# Patient Record
Sex: Female | Born: 1970 | Race: White | Hispanic: No | Marital: Single | Smoking: Never smoker
Health system: Southern US, Community
[De-identification: ages and names within clinical notes are randomized; demographics above are authoritative.]

## PROBLEM LIST (undated history)

## (undated) HISTORY — PX: MENISCUS REPAIR: SHX5179

---
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
## 1995-07-09 HISTORY — PX: GALLBLADDER SURGERY: SHX652

## 2006-02-18 DIAGNOSIS — N3946 Mixed incontinence: Secondary | ICD-10-CM | POA: Insufficient documentation

## 2019-07-09 HISTORY — PX: SIGMOIDECTOMY: SHX176

## 2021-03-20 DIAGNOSIS — K5909 Other constipation: Secondary | ICD-10-CM | POA: Insufficient documentation

## 2021-03-20 DIAGNOSIS — Z9049 Acquired absence of other specified parts of digestive tract: Secondary | ICD-10-CM | POA: Insufficient documentation

## 2021-03-20 DIAGNOSIS — K589 Irritable bowel syndrome without diarrhea: Secondary | ICD-10-CM | POA: Insufficient documentation

## 2021-03-20 DIAGNOSIS — Z8719 Personal history of other diseases of the digestive system: Secondary | ICD-10-CM | POA: Insufficient documentation

## 2021-05-22 LAB — HM PAP SMEAR: HPV, high-risk: NEGATIVE

## 2021-07-11 DIAGNOSIS — Z1231 Encounter for screening mammogram for malignant neoplasm of breast: Secondary | ICD-10-CM | POA: Diagnosis not present

## 2021-07-11 DIAGNOSIS — Z809 Family history of malignant neoplasm, unspecified: Secondary | ICD-10-CM | POA: Diagnosis not present

## 2021-08-28 DIAGNOSIS — E669 Obesity, unspecified: Secondary | ICD-10-CM | POA: Insufficient documentation

## 2021-08-28 DIAGNOSIS — K5730 Diverticulosis of large intestine without perforation or abscess without bleeding: Secondary | ICD-10-CM | POA: Insufficient documentation

## 2021-08-30 DIAGNOSIS — Z23 Encounter for immunization: Secondary | ICD-10-CM | POA: Diagnosis not present

## 2021-08-30 DIAGNOSIS — Z13220 Encounter for screening for lipoid disorders: Secondary | ICD-10-CM | POA: Diagnosis not present

## 2021-08-30 DIAGNOSIS — K581 Irritable bowel syndrome with constipation: Secondary | ICD-10-CM | POA: Diagnosis not present

## 2021-08-30 DIAGNOSIS — Z1329 Encounter for screening for other suspected endocrine disorder: Secondary | ICD-10-CM | POA: Diagnosis not present

## 2021-08-30 DIAGNOSIS — E669 Obesity, unspecified: Secondary | ICD-10-CM | POA: Diagnosis not present

## 2021-08-30 DIAGNOSIS — Z0001 Encounter for general adult medical examination with abnormal findings: Secondary | ICD-10-CM | POA: Diagnosis not present

## 2021-08-30 DIAGNOSIS — Z9049 Acquired absence of other specified parts of digestive tract: Secondary | ICD-10-CM | POA: Diagnosis not present

## 2021-08-30 DIAGNOSIS — Z8719 Personal history of other diseases of the digestive system: Secondary | ICD-10-CM | POA: Diagnosis not present

## 2021-08-30 DIAGNOSIS — Z6835 Body mass index (BMI) 35.0-35.9, adult: Secondary | ICD-10-CM | POA: Diagnosis not present

## 2021-08-30 DIAGNOSIS — Z0000 Encounter for general adult medical examination without abnormal findings: Secondary | ICD-10-CM | POA: Diagnosis not present

## 2021-09-01 ENCOUNTER — Other Ambulatory Visit: Payer: Self-pay | Admitting: Obstetrics and Gynecology

## 2021-09-01 DIAGNOSIS — Z9189 Other specified personal risk factors, not elsewhere classified: Secondary | ICD-10-CM

## 2021-09-01 DIAGNOSIS — Z803 Family history of malignant neoplasm of breast: Secondary | ICD-10-CM

## 2021-09-30 ENCOUNTER — Encounter: Payer: Self-pay | Admitting: Internal Medicine

## 2021-09-30 DIAGNOSIS — Z9889 Other specified postprocedural states: Secondary | ICD-10-CM | POA: Insufficient documentation

## 2021-09-30 NOTE — Progress Notes (Signed)
? ? ? ? ?  Subjective:  ? ? Patient ID: Katelyn Horne, female    DOB: 02/13/1971, 51 y.o.   MRN: [id number] ? ?This visit occurred during the SARS-CoV-2 public health emergency.  Safety protocols were in place, including screening questions prior to the visit, additional usage of staff PPE, and extensive cleaning of exam room while observing appropriate contact time as indicated for disinfecting solutions.   ? ? ?HPI ?She is here to establish with a new pcp.  Katelyn Horne is here for follow up of her chronic medical problems, including diverticulosis, IBS, obesity ? ? ?She runs regularly, she is very frustrated that she can not lose weight.  She 4 times a week 2 miles each. She lifts weights daily.  ? ?She has done optivia.  She did not like it and did not work.  ? ?She drinks a lot of water. ? ?She is menopausal. ? ? ?Medications and allergies reviewed with patient and updated if appropriate. ? ?No current outpatient medications on file prior to visit.  ? ?No current facility-administered medications on file prior to visit.  ? ? ? ?Review of Systems  ?Constitutional:  Negative for fever.  ?Respiratory:  Negative for cough, shortness of breath and wheezing.   ?Cardiovascular:  Positive for leg swelling (end of day). Negative for chest pain and palpitations.  ?Gastrointestinal:  Positive for constipation. Negative for abdominal pain and nausea.  ?Musculoskeletal:  Positive for arthralgias (left knee). Negative for back pain.  ?Neurological:  Negative for light-headedness and headaches.  ?Psychiatric/Behavioral:  Negative for dysphoric mood. The patient is not nervous/anxious.   ? ?   ?Objective:  ? ?Vitals:  ? 10/01/21 0905  ?BP: 138/82  ?Pulse: (!) 44  ?Temp: 98.6 ?F (37 ?C)  ?SpO2: 99%  ? ?BP Readings from Last 3 Encounters:  ?10/01/21 138/82  ? ?Wt Readings from Last 3 Encounters:  ?10/01/21 252 lb (114.3 kg)  ? ?Body mass index is 36.16 kg/m?. ? ?  ?Physical Exam ?Constitutional:   ?   General: She is not in acute  distress. ?   Appearance: Normal appearance.  ?HENT:  ?   Head: Normocephalic and atraumatic.  ?Eyes:  ?   Conjunctiva/sclera: Conjunctivae normal.  ?Cardiovascular:  ?   Rate and Rhythm: Normal rate and regular rhythm.  ?   Heart sounds: Normal heart sounds. No murmur heard. ?Pulmonary:  ?   Effort: Pulmonary effort is normal. No respiratory distress.  ?   Breath sounds: Normal breath sounds. No wheezing.  ?Musculoskeletal:  ?   Cervical back: Neck supple.  ?   Right lower leg: No edema.  ?   Left lower leg: No edema.  ?Lymphadenopathy:  ?   Cervical: No cervical adenopathy.  ?Skin: ?   Findings: No rash.  ?Neurological:  ?   Mental Status: She is alert. Mental status is at baseline.  ?Psychiatric:     ?   Mood and Affect: Mood normal.     ?   Behavior: Behavior normal.  ? ?   ? ?No results found for: WBC, HGB, HCT, PLT, GLUCOSE, CHOL, TRIG, HDL, LDLDIRECT, LDLCALC, ALT, AST, NA, K, CL, CREATININE, BUN, CO2, TSH, PSA, INR, GLUF, HGBA1C, MICROALBUR ? ? ?Assessment & Plan:  ? ? ?See Problem List for Assessment and Plan of chronic medical problems.  ? ? ?

## 2021-09-30 NOTE — Patient Instructions (Addendum)
? ? ? ?  It was nice to meet you.  ? ? ? ?Medications changes include :   Wegovy 0.25 mg weekly ? ? ?Your prescription(s) have been sent to your pharmacy.  ? ? ?

## 2021-10-01 ENCOUNTER — Encounter: Payer: Self-pay | Admitting: Internal Medicine

## 2021-10-01 ENCOUNTER — Other Ambulatory Visit: Payer: Self-pay

## 2021-10-01 ENCOUNTER — Ambulatory Visit: Payer: BC Managed Care – PPO | Admitting: Internal Medicine

## 2021-10-01 VITALS — BP 138/82 | HR 44 | Temp 98.6°F | Ht 70.0 in | Wt 252.0 lb

## 2021-10-01 DIAGNOSIS — K5909 Other constipation: Secondary | ICD-10-CM | POA: Diagnosis not present

## 2021-10-01 DIAGNOSIS — E6609 Other obesity due to excess calories: Secondary | ICD-10-CM | POA: Diagnosis not present

## 2021-10-01 DIAGNOSIS — K589 Irritable bowel syndrome without diarrhea: Secondary | ICD-10-CM | POA: Diagnosis not present

## 2021-10-01 DIAGNOSIS — Z6836 Body mass index (BMI) 36.0-36.9, adult: Secondary | ICD-10-CM | POA: Diagnosis not present

## 2021-10-01 DIAGNOSIS — Z9889 Other specified postprocedural states: Secondary | ICD-10-CM | POA: Insufficient documentation

## 2021-10-01 MED ORDER — SEMAGLUTIDE-WEIGHT MANAGEMENT 0.25 MG/0.5ML ~~LOC~~ SOAJ
0.2500 mg | SUBCUTANEOUS | 0 refills | Status: DC
Start: 2021-10-01 — End: 2021-10-29

## 2021-10-01 NOTE — Assessment & Plan Note (Signed)
No issues since sigmoid removed ?

## 2021-10-01 NOTE — Assessment & Plan Note (Addendum)
Chronic ?Taking miralax daily - sometimes does not work well, but overall controlled ?Continue drinking plenty of fluids, regular exercise ?

## 2021-10-01 NOTE — Assessment & Plan Note (Signed)
Chronic ?After her sigmoidectomy and in addition with menopause she has gained a lot of weight ?She is very frustrated because she has not been able to lose the weight ?She is exercising a good amount of regular basis-running any daily weights ?She is eating reasonably healthy and trying to limit her portions ?She is interested in assistance with medications to help with the weight loss ?Can try Wegovy if covered by insurance-she can try using a savings card ?If this is not covered she may need to call her insurance because unfortunately they may not cover any weight loss medications ?Discussed calorie intake as being the deciding factor for her weight loss ?

## 2021-10-02 ENCOUNTER — Telehealth: Payer: Self-pay | Admitting: Internal Medicine

## 2021-10-02 NOTE — Telephone Encounter (Signed)
Rep w/ [employer] states PA is needed for Semaglutide-Weight Management 0.25 MG/0.5ML SOAJ ? ?Rep states last 2 digits of pt members ID is [id number]  ? ? ?

## 2021-10-08 NOTE — Telephone Encounter (Signed)
Key: [id number] ?

## 2021-10-10 ENCOUNTER — Encounter: Payer: Self-pay | Admitting: Internal Medicine

## 2021-10-11 ENCOUNTER — Other Ambulatory Visit: Payer: Self-pay

## 2021-10-18 NOTE — Telephone Encounter (Signed)
Your prior authorization for Mancel Parsons has been approved! ? ?MORE INFO ?Personalized support and financial assistance may be available through the [facility] [employer] program. For more information, and to see program requirements, click on the More Info button to the right. ? ?Message from plan: Effective from 10/08/2021 through 02/10/2022. Please note: This medication is to be titrated up in strength every 4 weeks as tolerated by the member. The quantity limit set of 4 pens per 180 days allows for this titration through all strengths using 4 pens of each strength every 28 days. ?

## 2021-10-18 NOTE — Telephone Encounter (Signed)
Message sent to patient today.

## 2021-10-29 ENCOUNTER — Other Ambulatory Visit: Payer: Self-pay | Admitting: Internal Medicine

## 2021-10-29 MED ORDER — WEGOVY 0.5 MG/0.5ML ~~LOC~~ SOAJ: 0.5000 mg | SUBCUTANEOUS | 0 refills | Status: DC

## 2021-11-29 ENCOUNTER — Encounter: Payer: Self-pay | Admitting: Internal Medicine

## 2021-11-29 MED ORDER — ONDANSETRON HCL 4 MG PO TABS: 4.0000 mg | ORAL_TABLET | Freq: Three times a day (TID) | ORAL | 0 refills | Status: DC | PRN

## 2021-11-29 MED ORDER — WEGOVY 1 MG/0.5ML ~~LOC~~ SOAJ: 1.0000 mg | SUBCUTANEOUS | 0 refills | Status: DC

## 2021-12-01 ENCOUNTER — Other Ambulatory Visit: Payer: BC Managed Care – PPO

## 2021-12-10 ENCOUNTER — Encounter: Payer: Self-pay | Admitting: Podiatry

## 2021-12-10 ENCOUNTER — Other Ambulatory Visit: Payer: Self-pay | Admitting: Podiatry

## 2021-12-10 ENCOUNTER — Ambulatory Visit (INDEPENDENT_AMBULATORY_CARE_PROVIDER_SITE_OTHER): Payer: BC Managed Care – PPO

## 2021-12-10 ENCOUNTER — Ambulatory Visit: Payer: BC Managed Care – PPO | Admitting: Podiatry

## 2021-12-10 DIAGNOSIS — M778 Other enthesopathies, not elsewhere classified: Secondary | ICD-10-CM

## 2021-12-10 DIAGNOSIS — S99921A Unspecified injury of right foot, initial encounter: Secondary | ICD-10-CM | POA: Diagnosis not present

## 2021-12-10 DIAGNOSIS — M2041 Other hammer toe(s) (acquired), right foot: Secondary | ICD-10-CM

## 2021-12-10 DIAGNOSIS — D3613 Benign neoplasm of peripheral nerves and autonomic nervous system of lower limb, including hip: Secondary | ICD-10-CM

## 2021-12-10 NOTE — Progress Notes (Signed)
Subjective:  Patient ID: Katelyn Horne, female    DOB: [birth year],  MRN: [id number] HPI Chief Complaint  Patient presents with   Foot Pain    Plantar forefoot and 2nd/3rd toes right - aching, swelling, burning, numbness x couple years, just moved from [place] a year ago and has been having foot treated for a neuroma-stretching, padding, injection, now noticing toes are hammering   New Patient (Initial Visit)    51 y.o. female presents with the above complaint.   ROS: Denies fever chills nausea vomiting muscle aches pains calf pain back pain chest pain shortness of breath.  She states that she is very active individual and has seen a podiatrist in the past in [place] who stated that she had a neuroma.  She has tried and failed conservative therapies including injections steroids nonsteroidals therapies shoe gear changes and activity changes.  States that this is affecting her ability to perform her daily activities and maintain good health.  No past medical history on file. No past surgical history on file.  Current Outpatient Medications:    ondansetron (ZOFRAN) 4 MG tablet, Take 1 tablet (4 mg total) by mouth every 8 (eight) hours as needed for nausea or vomiting., Disp: 20 tablet, Rfl: 0   Semaglutide-Weight Management (WEGOVY) 1 MG/0.5ML SOAJ, Inject 1 mg into the skin once a week., Disp: 2 mL, Rfl: 0  Allergies  Allergen Reactions   Amoxicillin-Pot Clavulanate Hives, Rash and Other (See Comments)   Iodinated Contrast Media Rash   Iodine-131 Rash   Review of Systems Objective:  There were no vitals filed for this visit.  General: Well developed, nourished, in no acute distress, alert and oriented x3   Dermatological: Skin is warm, dry and supple bilateral. Nails x 10 are well maintained; remaining integument appears unremarkable at this time. There are no open sores, no preulcerative lesions, no rash or signs of infection present.  Vascular: Dorsalis Pedis artery and  Posterior Tibial artery pedal pulses are 2/4 bilateral with immedate capillary fill time. Pedal hair growth present. No varicosities and no lower extremity edema present bilateral.   Neruologic: Grossly intact via light touch bilateral. Vibratory intact via tuning fork bilateral. Protective threshold with Semmes Wienstein monofilament intact to all pedal sites bilateral. Patellar and Achilles deep tendon reflexes 2+ bilateral. No Babinski or clonus noted bilateral.   Musculoskeletal: No gross boney pedal deformities bilateral. No pain, crepitus, or limitation noted with foot and ankle range of motion bilateral. Muscular strength 5/5 in all groups tested bilateral.  She has pain on palpation and end range of motion of the second metatarsophalangeal joint with some tenderness on palpation of the PIPJ and radiating pain from that dorsal medial aspect of the PIPJ.  There is mild edema on the plantar aspect of the foot and other lesser metatarsophalangeal joints are nontender.  Gait: Unassisted, Nonantalgic.    Radiographs: Radiographs taken today demonstrate an osseously mature individual plantarflexed elongated second metatarsal of the right foot with a cocked up hammertoe deformity which appears to be rigid in nature at the PIPJ.  She does have some soft tissue swelling around the metatarsal phalangeal joint there is some mild medial deviation of the second toe indicating a possible tear of the lateral collateral ligament or the plantar plate.  No other digital deformities are identified on this right foot exam.    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  Plan: At this point discussed appropriate shoe gear.  We are also requesting an MRI of this right foot specifically the second metatarsophalangeal joint area for  evaluation of the plantar plate.  This is for differential diagnoses and surgical planning.     Shanedra Lave T. [place], [place]

## 2021-12-14 ENCOUNTER — Encounter: Payer: Self-pay | Admitting: Internal Medicine

## 2021-12-17 ENCOUNTER — Ambulatory Visit
Admission: RE | Admit: 2021-12-17 | Discharge: 2021-12-17 | Disposition: A | Discharge status: Home / Self Care | Payer: BC Managed Care – PPO | Source: Ambulatory Visit | Attending: Podiatry | Admitting: Podiatry

## 2021-12-17 DIAGNOSIS — G5761 Lesion of plantar nerve, right lower limb: Secondary | ICD-10-CM | POA: Diagnosis not present

## 2021-12-17 DIAGNOSIS — M2041 Other hammer toe(s) (acquired), right foot: Secondary | ICD-10-CM

## 2021-12-17 DIAGNOSIS — S99921A Unspecified injury of right foot, initial encounter: Secondary | ICD-10-CM

## 2021-12-26 ENCOUNTER — Encounter: Payer: Self-pay | Admitting: Podiatry

## 2021-12-26 ENCOUNTER — Ambulatory Visit: Payer: BC Managed Care – PPO | Admitting: Podiatry

## 2021-12-26 DIAGNOSIS — M21961 Unspecified acquired deformity of right lower leg: Secondary | ICD-10-CM | POA: Diagnosis not present

## 2021-12-26 DIAGNOSIS — M2041 Other hammer toe(s) (acquired), right foot: Secondary | ICD-10-CM

## 2021-12-26 NOTE — Progress Notes (Signed)
She presents today for follow-up of her pain to her right foot.  She states that really has not improved at all she states that she went running yesterday and it really has been painful.  Objective: Vital signs stable alert and oriented x3.  Pulses are palpable.  Cocked up hammertoe deformities #2 #3 of the right foot with pain to the third interdigital space of the right foot.  MRI states that there is a partial tear of the plantar plate of the right second metatarsal phalangeal joint with hammertoe deformities #2 and #3 and a neuroma to the third interdigital space of the right foot.  Assessment: Pain in limb secondary to capsulitis hammertoe deformity plantar plate tear and neuroma third interspace right foot.  Plan: Discussed etiology pathology and surgical therapies at this point we consented her today for a surgical repair of the plantar plate second metatarsal osteotomy hammertoe repair #2 #3 with pain and and a neurectomy to the third interdigital space of the right foot.  She understands this and is amenable to it we did discuss possible postop complications which may include but not limited to postop pain bleeding swell infection recurrence need for further surgery overcorrection under correction also digit loss of limb loss of life.  Follow-up with her in the near future for surgical intervention.

## 2022-01-02 MED ORDER — WEGOVY 1.7 MG/0.75ML ~~LOC~~ SOAJ
1.7000 mg | SUBCUTANEOUS | 0 refills | Status: DC
Start: 2022-01-02 — End: 2022-02-06

## 2022-01-29 ENCOUNTER — Telehealth: Payer: Self-pay | Admitting: Urology

## 2022-01-29 NOTE — Telephone Encounter (Signed)
DOS - 02/15/22  METATARSAL OSTEOTOMY 2ND RIGHT --- 28308 HAMMERTOE REPAIR 2ND RIGHT --- 09200 NEURECTOMY 3RD RIGHT --- 41593  [employer] EFFECTIVE DATE - 07/08/21  PLAN DEDUCTIBLE - $1,000.00 W/ $47.92 REMAINING OUT OF POCKET - $3,500.00 W/ $2,172.15 REMAINING COINSURANCE - 20% COPAY - $0.00  NO PRIOR AUTH IS REQUIRED.

## 2022-02-05 ENCOUNTER — Encounter: Payer: Self-pay | Admitting: Internal Medicine

## 2022-02-05 HISTORY — PX: FOOT NEUROMA SURGERY: SHX646

## 2022-02-05 HISTORY — PX: OTHER SURGICAL HISTORY: SHX169

## 2022-02-06 MED ORDER — SEMAGLUTIDE-WEIGHT MANAGEMENT 2.4 MG/0.75ML ~~LOC~~ SOAJ: 2.4000 mg | SUBCUTANEOUS | 0 refills | Status: DC

## 2022-02-09 ENCOUNTER — Ambulatory Visit
Admission: RE | Admit: 2022-02-09 | Discharge: 2022-02-09 | Disposition: A | Discharge status: Home / Self Care | Payer: BC Managed Care – PPO | Source: Ambulatory Visit | Attending: Obstetrics and Gynecology | Admitting: Obstetrics and Gynecology

## 2022-02-09 DIAGNOSIS — Z1239 Encounter for other screening for malignant neoplasm of breast: Secondary | ICD-10-CM | POA: Diagnosis not present

## 2022-02-09 DIAGNOSIS — Z9189 Other specified personal risk factors, not elsewhere classified: Secondary | ICD-10-CM

## 2022-02-09 DIAGNOSIS — Z803 Family history of malignant neoplasm of breast: Secondary | ICD-10-CM

## 2022-02-09 MED ORDER — GADOBUTROL 1 MMOL/ML IV SOLN
10.0000 mL | Freq: Once | INTRAVENOUS | Status: AC | PRN
Start: 2022-02-09 — End: 2022-02-09
  Administered 2022-02-09: 10 mL via INTRAVENOUS

## 2022-02-13 ENCOUNTER — Other Ambulatory Visit: Payer: Self-pay | Admitting: Podiatry

## 2022-02-13 MED ORDER — OXYCODONE-ACETAMINOPHEN 10-325 MG PO TABS: 1.0000 | ORAL_TABLET | Freq: Three times a day (TID) | ORAL | 0 refills | Status: AC | PRN

## 2022-02-13 MED ORDER — CLINDAMYCIN HCL 150 MG PO CAPS: 150.0000 mg | ORAL_CAPSULE | Freq: Three times a day (TID) | ORAL | 0 refills | Status: DC

## 2022-02-13 MED ORDER — ONDANSETRON HCL 4 MG PO TABS: 4.0000 mg | ORAL_TABLET | Freq: Three times a day (TID) | ORAL | 0 refills | Status: DC | PRN

## 2022-02-15 ENCOUNTER — Other Ambulatory Visit: Payer: Self-pay | Admitting: Podiatrist

## 2022-02-15 DIAGNOSIS — M2041 Other hammer toe(s) (acquired), right foot: Secondary | ICD-10-CM | POA: Diagnosis not present

## 2022-02-15 DIAGNOSIS — M205X1 Other deformities of toe(s) (acquired), right foot: Secondary | ICD-10-CM | POA: Diagnosis not present

## 2022-02-15 DIAGNOSIS — M21541 Acquired clubfoot, right foot: Secondary | ICD-10-CM | POA: Diagnosis not present

## 2022-02-15 DIAGNOSIS — G8918 Other acute postprocedural pain: Secondary | ICD-10-CM | POA: Diagnosis not present

## 2022-02-15 DIAGNOSIS — G5761 Lesion of plantar nerve, right lower limb: Secondary | ICD-10-CM | POA: Diagnosis not present

## 2022-02-15 MED ORDER — TRAMADOL HCL 50 MG PO TABS: 50.0000 mg | ORAL_TABLET | Freq: Three times a day (TID) | ORAL | 0 refills | Status: AC | PRN

## 2022-02-15 NOTE — Progress Notes (Signed)
Rx for Tramadol called in for post op pain at Dr. Stephenie Acres request

## 2022-02-20 ENCOUNTER — Encounter: Payer: Self-pay | Admitting: Podiatry

## 2022-02-20 ENCOUNTER — Ambulatory Visit (INDEPENDENT_AMBULATORY_CARE_PROVIDER_SITE_OTHER): Payer: BC Managed Care – PPO

## 2022-02-20 ENCOUNTER — Ambulatory Visit (INDEPENDENT_AMBULATORY_CARE_PROVIDER_SITE_OTHER): Payer: BC Managed Care – PPO | Admitting: Podiatry

## 2022-02-20 DIAGNOSIS — M2041 Other hammer toe(s) (acquired), right foot: Secondary | ICD-10-CM

## 2022-02-20 DIAGNOSIS — Z9889 Other specified postprocedural states: Secondary | ICD-10-CM

## 2022-02-20 DIAGNOSIS — M21961 Unspecified acquired deformity of right lower leg: Secondary | ICD-10-CM

## 2022-02-20 NOTE — Progress Notes (Signed)
She presents today for her first postop visit date of surgery 02/15/2022 with metatarsal osteotomy second right hammertoe repair #2 #3 with a neurectomy third interdigital space of the right foot.  States that is been really painful the pain medication is not really helping though she is referring to the tramadol because she does not want to take the oxycodone.  She denies fever chills nausea vomiting muscle aches pains calf pain back pain chest pain shortness of breath.  Objective: Vital signs are stable alert oriented x3 CAM Walker is intact was removed dry sterile dressing is intact was removed demonstrates mild erythema about the toes of the surgical portion of the foot K wires are in place.  Radiographs taken today demonstrate good placement of the internal fixation to the second metatarsal osteotomy as well as the second and third toes with good approximation of the arthrodesis site.  Assessment: Well-healing surgical foot right.  Plan: Redressed today dressed a compressive dressing placed her back in her cam walker follow-up with her in 1 week make sure she is doing well.  Explained her sutures may not come out next week she understands and is amenable to it but we would like to switch her down to a smaller boot or shoe.

## 2022-02-27 ENCOUNTER — Encounter: Payer: Self-pay | Admitting: Podiatry

## 2022-02-27 ENCOUNTER — Ambulatory Visit (INDEPENDENT_AMBULATORY_CARE_PROVIDER_SITE_OTHER): Payer: BC Managed Care – PPO | Admitting: Podiatry

## 2022-02-27 DIAGNOSIS — M2041 Other hammer toe(s) (acquired), right foot: Secondary | ICD-10-CM

## 2022-02-27 DIAGNOSIS — Z9889 Other specified postprocedural states: Secondary | ICD-10-CM

## 2022-02-27 DIAGNOSIS — M21961 Unspecified acquired deformity of right lower leg: Secondary | ICD-10-CM

## 2022-02-27 NOTE — Progress Notes (Signed)
Patient presents today for post op visit # 2, patient of Dr. Milinda Pointer.   POV #2 DOS 02/15/2022 2ND METATARSALOSTEOTOMY RT, HAMMERTOE REPAIR 2ND/3RD TOES RT, NEURECTOMY 3RD IDS RT FOOT   She presents in her walking boot. Denies any falls or injury to the foot. There is a blood blister at the 2nd toe distal incision. No signs of infection. No calf pain or shortness of breath. Bandages dry and intact. Incision and sutures intact. She is having minimal pain, but concerned about the 2nd toe.   Sutures removed today from the 3rd toe. Sutures remain in 2nd toe until next week. Redressed with a light DSD and covered with an acewrap.       Placed her back in the boot until re-evaluation next week. Reviewed icing and elevation. She will follow up with Dr. Milinda Pointer next week for suture removal from the 2nd toe. She will also move her next appointment out 1 more week as well.

## 2022-02-28 NOTE — Patient Instructions (Addendum)
       Medications changes include :   none     Your prescription(s) have been sent to your pharmacy.      Return in about 6 months (around 09/01/2022).

## 2022-02-28 NOTE — Progress Notes (Signed)
      Subjective:    Patient ID: Katelyn Horne, female    DOB: Mar 13, 1971, 51 y.o.   MRN: [id number]     HPI Radley is here for follow up of her chronic medical problems, including obesity.  She is currently on wegovy 2.4 mg weekly.  She has waves of nausea at times. She has constipation but takes miralax daily.  This is controlled.  Her appetite is very low.  She eats protein shakes, pretzels, chicken, fish.  She feels like she is eating well overall.  She is very happy with medication and the effect that it has had on her weight.  She feels the side effects are mild and controllable.    Wt Readings from Last 3 Encounters:  03/01/22 224 lb (101.6 kg)  10/01/21 252 lb (114.3 kg)   Her goal weight is 190 lbs.      Medications and allergies reviewed with patient and updated if appropriate.  Current Outpatient Medications on File Prior to Visit  Medication Sig Dispense Refill   ondansetron (ZOFRAN) 4 MG tablet Take 1 tablet (4 mg total) by mouth every 8 (eight) hours as needed. 20 tablet 0   Semaglutide-Weight Management 2.4 MG/0.75ML SOAJ Inject 2.4 mg into the skin once a week. 3 mL 0   No current facility-administered medications on file prior to visit.     Review of Systems  Constitutional:  Negative for fatigue and fever.  Gastrointestinal:  Positive for abdominal distention, constipation and nausea. Negative for abdominal pain.       No gerd  Neurological:  Positive for light-headedness (when she does not eat enough). Negative for headaches.  Psychiatric/Behavioral:  Positive for sleep disturbance (interrupted sleep).        Objective:   Vitals:   03/01/22 1303  BP: 110/82  Pulse: 71  Temp: 98.2 F (36.8 C)  SpO2: 97%   BP Readings from Last 3 Encounters:  03/01/22 110/82  10/01/21 138/82   Wt Readings from Last 3 Encounters:  03/01/22 224 lb (101.6 kg)  10/01/21 252 lb (114.3 kg)   Body mass index is 32.14 kg/m.    Physical  Exam Constitutional:      General: She is not in acute distress.    Appearance: Normal appearance.  HENT:     Head: Normocephalic and atraumatic.  Eyes:     Conjunctiva/sclera: Conjunctivae normal.  Cardiovascular:     Rate and Rhythm: Normal rate and regular rhythm.     Heart sounds: Normal heart sounds. No murmur heard. Pulmonary:     Effort: Pulmonary effort is normal. No respiratory distress.     Breath sounds: Normal breath sounds. No wheezing.  Musculoskeletal:     Right lower leg: No edema.     Left lower leg: No edema.  Skin:    General: Skin is warm and dry.     Findings: No rash.  Neurological:     Mental Status: She is alert. Mental status is at baseline.  Psychiatric:        Mood and Affect: Mood normal.        Behavior: Behavior normal.        No results found for: "WBC", "HGB", "HCT", "PLT", "GLUCOSE", "CHOL", "TRIG", "HDL", "LDLDIRECT", "LDLCALC", "ALT", "AST", "NA", "K", "CL", "CREATININE", "BUN", "CO2", "TSH", "PSA", "INR", "GLUF", "HGBA1C", "MICROALBUR"   Assessment & Plan:    See Problem List for Assessment and Plan of chronic medical problems.

## 2022-03-01 ENCOUNTER — Encounter: Payer: Self-pay | Admitting: Internal Medicine

## 2022-03-01 ENCOUNTER — Ambulatory Visit: Payer: BC Managed Care – PPO | Admitting: Internal Medicine

## 2022-03-01 DIAGNOSIS — E669 Obesity, unspecified: Secondary | ICD-10-CM

## 2022-03-01 DIAGNOSIS — Z9049 Acquired absence of other specified parts of digestive tract: Secondary | ICD-10-CM

## 2022-03-01 MED ORDER — SEMAGLUTIDE-WEIGHT MANAGEMENT 2.4 MG/0.75ML ~~LOC~~ SOAJ: 2.4000 mg | SUBCUTANEOUS | 0 refills | Status: DC

## 2022-03-01 NOTE — Assessment & Plan Note (Signed)
Chronic Currently on the Wegovy 2.4 mg weekly and has some mild nausea and constipation, but symptoms are mild and controllable-overall tolerating medication well Medication has been very effective Starting weight 252 pound-weight today 224 pound-weight loss of 28 pounds She just had surgery on her foot and is not able to exercise, but typically exercises regularly with running and walking and will get back to that as soon as she is able Eating healthy-stressed making sure she is getting enough protein, vegetables and fruits in her diet.  She is eating although carbohydrate/sugar diet Discussed short-term and long-term goals with the weight loss Her goal weight is approximately 190 pounds Would recommend continuing Wegovy at 2.4 mg weekly for now Consider in the future reducing dose and finding a maintenance dose Stressed again regular exercise, healthy diet with small portions and lifestyle to help weight loss efforts and then to help maintain goal weight once we get their Wegovy 2.4 mg weekly sent to pharmacy

## 2022-03-06 ENCOUNTER — Ambulatory Visit (INDEPENDENT_AMBULATORY_CARE_PROVIDER_SITE_OTHER): Payer: BC Managed Care – PPO | Admitting: Podiatry

## 2022-03-06 ENCOUNTER — Encounter: Payer: Self-pay | Admitting: Podiatry

## 2022-03-06 ENCOUNTER — Telehealth: Payer: Self-pay

## 2022-03-06 DIAGNOSIS — M2041 Other hammer toe(s) (acquired), right foot: Secondary | ICD-10-CM | POA: Diagnosis not present

## 2022-03-06 DIAGNOSIS — Z9889 Other specified postprocedural states: Secondary | ICD-10-CM

## 2022-03-06 DIAGNOSIS — M21961 Unspecified acquired deformity of right lower leg: Secondary | ICD-10-CM

## 2022-03-06 NOTE — Telephone Encounter (Signed)
Katelyn Horne ([name]) Rx #: [id number] PEAKLT 2.'4MG'$ /0.75ML auto-injectors

## 2022-03-06 NOTE — Progress Notes (Signed)
She presents today for follow-up of her second metatarsal osteotomy hammertoe repair #2 #3 of the right foot with neurectomy.  She states that she seems to be doing good feels all right just a little sore occasionally.  Objective: Vital signs are stable alert and oriented x3 there is no erythema edema cellulitis drainage or odor sutures are intact margins well coapted some of the sutures were removed last week I removed the sutures to the second toe today.  She has a small area of blistering near the distal aspect of the toe medially.  Assessment: Well-healing surgical toe K wires are intact.  Plan: Redressed her today and put her in a Darco shoe or follow-up with her in a week to 2 weeks just to redress.

## 2022-03-18 ENCOUNTER — Encounter: Payer: BC Managed Care – PPO | Admitting: Podiatry

## 2022-03-18 ENCOUNTER — Ambulatory Visit (INDEPENDENT_AMBULATORY_CARE_PROVIDER_SITE_OTHER): Payer: BC Managed Care – PPO

## 2022-03-18 ENCOUNTER — Ambulatory Visit (INDEPENDENT_AMBULATORY_CARE_PROVIDER_SITE_OTHER): Payer: BC Managed Care – PPO | Admitting: Podiatry

## 2022-03-18 ENCOUNTER — Encounter: Payer: Self-pay | Admitting: Internal Medicine

## 2022-03-18 DIAGNOSIS — M2041 Other hammer toe(s) (acquired), right foot: Secondary | ICD-10-CM | POA: Diagnosis not present

## 2022-03-18 DIAGNOSIS — M21961 Unspecified acquired deformity of right lower leg: Secondary | ICD-10-CM

## 2022-03-18 DIAGNOSIS — Z9889 Other specified postprocedural states: Secondary | ICD-10-CM

## 2022-03-18 NOTE — Progress Notes (Signed)
She presents today for postop visit date of surgery 02/15/2022 Seck metatarsal osteotomy and hammertoe repair #2 #3 and neurectomy on the right foot.  Denies fever chills nausea vomit muscle aches pains states that she seems to be doing okay she is just tired of sitting around waiting for these toes to get better.  Objective: Vital signs are stable alert oriented x3 mild edema no erythema cellulitis drainage or odor eschars are still present.  K wires are in place but slightly extruded.  No purulence no malodor radiographs taken today demonstrate well-healing arthrodesis PIPJ.  Assessment: Well-healing surgical foot.  Plan: Follow-up with me in 2 weeks to pull the pins.  X-rays will be necessary at that time.

## 2022-04-01 ENCOUNTER — Encounter: Payer: Self-pay | Admitting: Podiatry

## 2022-04-01 ENCOUNTER — Ambulatory Visit (INDEPENDENT_AMBULATORY_CARE_PROVIDER_SITE_OTHER): Payer: BC Managed Care – PPO | Admitting: Podiatry

## 2022-04-01 ENCOUNTER — Ambulatory Visit (INDEPENDENT_AMBULATORY_CARE_PROVIDER_SITE_OTHER): Payer: BC Managed Care – PPO

## 2022-04-01 ENCOUNTER — Telehealth: Payer: Self-pay

## 2022-04-01 DIAGNOSIS — M2041 Other hammer toe(s) (acquired), right foot: Secondary | ICD-10-CM

## 2022-04-01 DIAGNOSIS — M21961 Unspecified acquired deformity of right lower leg: Secondary | ICD-10-CM | POA: Diagnosis not present

## 2022-04-01 DIAGNOSIS — Z9889 Other specified postprocedural states: Secondary | ICD-10-CM

## 2022-04-01 NOTE — Progress Notes (Signed)
She presents today date of surgery 02/15/2022 Seck metatarsal osteotomy right with hammertoe repair second and third neurectomy third interdigital space right foot.  She states that I am ready for these pins to come out she states that I can feel that moving around.  Objective: Vital signs are stable she is alert oriented x3 there is no erythema to some mild edema no cellulitis drainage odor incision sites have gone on to heal uneventfully dry scaly skin to the foot itself.  K wires to the second and third toes are loose.  Radiographs taken today demonstrate arthrodesis of the PIPJ second and third toes appears to be complete.  K wires appear to be in 1 piece.  Assessment: Well-healing surgical foot x6 weeks  Plan: Removed K wire today without any anesthesia she tolerated the procedure well without complications.  K wires were removed they were in 1 piece and in total.  I would allow her to wear her Darco shoe and start washing this tomorrow I will follow-up with her in 2 to 3 weeks.

## 2022-04-01 NOTE — Telephone Encounter (Signed)
Wegovy approved from 03/20/22- 03/19/2023.  Ref # [id number]

## 2022-04-24 ENCOUNTER — Encounter: Payer: Self-pay | Admitting: Podiatry

## 2022-04-24 ENCOUNTER — Ambulatory Visit (INDEPENDENT_AMBULATORY_CARE_PROVIDER_SITE_OTHER): Payer: BC Managed Care – PPO | Admitting: Podiatry

## 2022-04-24 ENCOUNTER — Ambulatory Visit (INDEPENDENT_AMBULATORY_CARE_PROVIDER_SITE_OTHER): Payer: BC Managed Care – PPO

## 2022-04-24 DIAGNOSIS — M21961 Unspecified acquired deformity of right lower leg: Secondary | ICD-10-CM | POA: Diagnosis not present

## 2022-04-24 DIAGNOSIS — M2041 Other hammer toe(s) (acquired), right foot: Secondary | ICD-10-CM

## 2022-04-24 DIAGNOSIS — Z9889 Other specified postprocedural states: Secondary | ICD-10-CM

## 2022-04-24 NOTE — Progress Notes (Signed)
She presents today for follow-up she is status post neurectomy third interdigital space right foot second metatarsal osteotomy with screw fixation and hammertoe repair to toes #2 and #3 of the right foot.  She states that is still stiff and gets a little tender with walking.  Objective: Vital signs are stable alert and oriented x3.  Pulses are palpable.  Her toes are sitting rectus in very good position.  She has some tenderness on end range of motion dorsiflexion but minimally plantarflexion.  She is still has some scar that has subcutaneous adhesions.  Assessment: Well-healing surgical foot limited in motion most likely secondary to scar tissue.  Plan: Referring to physical therapy for range of motion and scar tissue resolution.

## 2022-04-30 ENCOUNTER — Other Ambulatory Visit: Payer: Self-pay

## 2022-04-30 MED ORDER — SEMAGLUTIDE-WEIGHT MANAGEMENT 2.4 MG/0.75ML ~~LOC~~ SOAJ: 2.4000 mg | SUBCUTANEOUS | 0 refills | Status: DC

## 2022-05-16 ENCOUNTER — Telehealth (INDEPENDENT_AMBULATORY_CARE_PROVIDER_SITE_OTHER): Payer: BC Managed Care – PPO | Admitting: Nurse Practitioner

## 2022-05-16 DIAGNOSIS — U071 COVID-19: Secondary | ICD-10-CM | POA: Diagnosis not present

## 2022-05-16 MED ORDER — MOLNUPIRAVIR EUA 200MG CAPSULE: 4.0000 | ORAL_CAPSULE | Freq: Two times a day (BID) | ORAL | 0 refills | Status: AC

## 2022-05-16 MED ORDER — PROMETHAZINE-DM 6.25-15 MG/5ML PO SYRP: 5.0000 mL | ORAL_SOLUTION | Freq: Three times a day (TID) | ORAL | 0 refills | Status: DC | PRN

## 2022-05-16 NOTE — Progress Notes (Signed)
Established Patient Office Visit  An audio-only tele-health visit was completed today for this patient. I connected with  Katelyn Horne on 05/16/22 utilizing audio-only technology and verified that I am speaking with the correct person using two identifiers. The patient was located at their home, and I was located at the office of [place] at [facility] during the encounter. I discussed the limitations of evaluation and management by telemedicine. The patient expressed understanding and agreed to proceed.     Subjective   Patient ID: Katelyn Horne, female    DOB: 1970/08/29  Age: 51 y.o. MRN: [id number]  Chief Complaint  Patient presents with   Covid Positive   Day 3 of symptom onset.  Took at home COVID test 2 days ago which was positive.  Experiencing cough, shortness of breath, congestion, and headaches.  Reports even with minor exertion she is short of breath.  Overall does feel symptoms are improving as she was experiencing chills originally but these seem to have subsided.  Has been vaccinated against COVID x3.  This is second time she had COVID infection.  Denies known past medical history of lung disease, does have obesity.  Actively working on weight loss.  Reports having lost about 24 pounds since last time she was seen in the office.     Review of Systems  Constitutional:  Negative for chills and fever.  HENT:  Positive for congestion.   Respiratory:  Positive for cough and shortness of breath.   Cardiovascular:  Negative for chest pain.  Neurological:  Positive for headaches.      Objective:     There were no vitals taken for this visit. BP Readings from Last 3 Encounters:  03/01/22 110/82  10/01/21 138/82   Wt Readings from Last 3 Encounters:  03/01/22 224 lb (101.6 kg)  10/01/21 252 lb (114.3 kg)      Physical Exam Comprehensive physical exam not completed today as office visit was conducted remotely.  Patient sounds well over the phone.   Patient was alert and oriented, and appeared to have appropriate judgment.   No results found for any visits on 05/16/22.    The 10-year ASCVD risk score (Arnett DK, et al., 2019) is: 0.5%    Assessment & Plan:   Problem List Items Addressed This Visit       Other   COVID-19 - Primary    Acute, treat with supportive measures.  Promethazine DM prescribed for cough suppression, patient encouraged to avoid driving while taking medication as well as to avoid operating heavy machinery as this can make her drowsy.  Recommend rest, increase fluid intake daily, ambulate in her home a few times a day to promote lung expansion and blood circulation.  Educated on current isolation guidelines.  Recommended to call office or proceed to the emergency part if symptoms worsen.  Risk for severe disease probably low based on patient's age, vaccination history, and lack of severe medical history.  However patient does have obesity and therefore may be at slightly increased risk for severe disease, will prescribe molnupiravir.  Patient educated to start medication by Saturday if she feels she needs it.  Patient reports understanding.      Relevant Medications   promethazine-dextromethorphan (PROMETHAZINE-DM) 6.25-15 MG/5ML syrup   molnupiravir EUA (LAGEVRIO) 200 mg CAPS capsule    Return if symptoms worsen or fail to improve.  Total time spent in telephone was 11 minutes and 50 seconds.   Ailene Ards, NP

## 2022-05-16 NOTE — Assessment & Plan Note (Signed)
Acute, treat with supportive measures.  Promethazine DM prescribed for cough suppression, patient encouraged to avoid driving while taking medication as well as to avoid operating heavy machinery as this can make her drowsy.  Recommend rest, increase fluid intake daily, ambulate in her home a few times a day to promote lung expansion and blood circulation.  Educated on current isolation guidelines.  Recommended to call office or proceed to the emergency part if symptoms worsen.  Risk for severe disease probably low based on patient's age, vaccination history, and lack of severe medical history.  However patient does have obesity and therefore may be at slightly increased risk for severe disease, will prescribe molnupiravir.  Patient educated to start medication by Saturday if she feels she needs it.  Patient reports understanding.

## 2022-05-27 ENCOUNTER — Other Ambulatory Visit: Payer: Self-pay | Admitting: Internal Medicine

## 2022-05-27 MED ORDER — SEMAGLUTIDE-WEIGHT MANAGEMENT 2.4 MG/0.75ML ~~LOC~~ SOAJ: 2.4000 mg | SUBCUTANEOUS | 0 refills | Status: DC

## 2022-05-28 MED ORDER — ONDANSETRON HCL 4 MG PO TABS: 4.0000 mg | ORAL_TABLET | Freq: Three times a day (TID) | ORAL | 0 refills | Status: DC | PRN

## 2022-06-26 ENCOUNTER — Other Ambulatory Visit: Payer: Self-pay

## 2022-06-26 MED ORDER — SEMAGLUTIDE-WEIGHT MANAGEMENT 2.4 MG/0.75ML ~~LOC~~ SOAJ: 2.4000 mg | SUBCUTANEOUS | 0 refills | Status: DC

## 2022-07-12 DIAGNOSIS — Z6829 Body mass index (BMI) 29.0-29.9, adult: Secondary | ICD-10-CM | POA: Diagnosis not present

## 2022-07-12 DIAGNOSIS — Z1231 Encounter for screening mammogram for malignant neoplasm of breast: Secondary | ICD-10-CM | POA: Diagnosis not present

## 2022-07-12 DIAGNOSIS — Z01419 Encounter for gynecological examination (general) (routine) without abnormal findings: Secondary | ICD-10-CM | POA: Diagnosis not present

## 2022-08-15 ENCOUNTER — Encounter: Payer: Self-pay | Admitting: Internal Medicine

## 2022-08-15 NOTE — Patient Instructions (Addendum)
        Medications changes include :   cough medication, antibiotic and zofran     Return if symptoms worsen or fail to improve.

## 2022-08-15 NOTE — Progress Notes (Signed)
    Subjective:    Patient ID: Katelyn Horne, female    DOB: [birth year], 52 y.o.   MRN: [id number]      HPI Katelyn Horne is here for  Chief Complaint  Patient presents with   Fever    Chills , sore throat , headache been since Monday took otc med and not helping    She is here for an acute visit for cold symptoms.   Her symptoms started 4 days ago - Monday 4 am.  She is experiencing f/c, congestion, PND, sinus pain/pressure, ST, cough (just starting), HA, lightheadedness/dizziness.    She has tried taking tylenol, advil, zofran   Covid and flu tests today here are negative   Medications and allergies reviewed with patient and updated if appropriate.  Current Outpatient Medications on File Prior to Visit  Medication Sig Dispense Refill   Semaglutide-Weight Management 2.4 MG/0.75ML SOAJ Inject 2.4 mg into the skin once a week. 3 mL 0   Semaglutide-Weight Management 2.4 MG/0.75ML SOAJ Inject 2.4 mg into the skin once a week. 3 mL 0   No current facility-administered medications on file prior to visit.    Review of Systems  Constitutional:  Positive for chills and fever.  HENT:  Positive for congestion, postnasal drip, sinus pressure, sinus pain and sore throat. Negative for ear pain.   Respiratory:  Positive for cough (just starting now). Negative for shortness of breath and wheezing.   Cardiovascular:  Negative for chest pain.  Gastrointestinal:  Positive for diarrhea (once) and nausea (hungry).  Musculoskeletal:  Positive for myalgias.  Neurological:  Positive for dizziness, light-headedness and headaches.       Objective:   Vitals:   08/16/22 1336  BP: 122/80  Pulse: 60  Temp: 97.9 F (36.6 C)  SpO2: 100%   BP Readings from Last 3 Encounters:  08/16/22 122/80  03/01/22 110/82  10/01/21 138/82   Wt Readings from Last 3 Encounters:  08/16/22 203 lb (92.1 kg)  03/01/22 224 lb (101.6 kg)  10/01/21 252 lb (114.3 kg)   Body mass index is 29.13 kg/m.     Physical Exam Constitutional:      General: She is not in acute distress.    Appearance: Normal appearance. She is not ill-appearing.  HENT:     Head: Normocephalic and atraumatic.     Right Ear: Tympanic membrane, ear canal and external ear normal.     Left Ear: Tympanic membrane, ear canal and external ear normal.     Mouth/Throat:     Mouth: Mucous membranes are moist.     Pharynx: No oropharyngeal exudate or posterior oropharyngeal erythema.  Eyes:     Conjunctiva/sclera: Conjunctivae normal.  Cardiovascular:     Rate and Rhythm: Normal rate and regular rhythm.  Pulmonary:     Effort: Pulmonary effort is normal. No respiratory distress.     Breath sounds: Normal breath sounds. No wheezing or rales.  Musculoskeletal:     Cervical back: Neck supple. No tenderness.  Lymphadenopathy:     Cervical: No cervical adenopathy.  Skin:    General: Skin is warm and dry.  Neurological:     Mental Status: She is alert.            Assessment & Plan:    See Problem List for Assessment and Plan of chronic medical problems.

## 2022-08-16 ENCOUNTER — Ambulatory Visit: Payer: BC Managed Care – PPO | Admitting: Internal Medicine

## 2022-08-16 ENCOUNTER — Encounter: Payer: Self-pay | Admitting: Internal Medicine

## 2022-08-16 VITALS — BP 122/80 | HR 60 | Temp 97.9°F | Ht 70.0 in | Wt 203.0 lb

## 2022-08-16 DIAGNOSIS — J019 Acute sinusitis, unspecified: Secondary | ICD-10-CM | POA: Insufficient documentation

## 2022-08-16 DIAGNOSIS — R051 Acute cough: Secondary | ICD-10-CM

## 2022-08-16 DIAGNOSIS — J0100 Acute maxillary sinusitis, unspecified: Secondary | ICD-10-CM

## 2022-08-16 DIAGNOSIS — U071 COVID-19: Secondary | ICD-10-CM

## 2022-08-16 LAB — POCT INFLUENZA A/B
Influenza A, POC: NEGATIVE
Influenza B, POC: NEGATIVE

## 2022-08-16 LAB — POC COVID19 BINAXNOW: SARS Coronavirus 2 Ag: NEGATIVE

## 2022-08-16 MED ORDER — PROMETHAZINE-DM 6.25-15 MG/5ML PO SYRP: 5.0000 mL | ORAL_SOLUTION | Freq: Three times a day (TID) | ORAL | 0 refills | Status: DC | PRN

## 2022-08-16 MED ORDER — ONDANSETRON HCL 4 MG PO TABS: 4.0000 mg | ORAL_TABLET | Freq: Three times a day (TID) | ORAL | 0 refills | Status: DC | PRN

## 2022-08-16 MED ORDER — CEFDINIR 300 MG PO CAPS: 300.0000 mg | ORAL_CAPSULE | Freq: Two times a day (BID) | ORAL | 0 refills | Status: DC

## 2022-08-16 NOTE — Assessment & Plan Note (Signed)
Acute Likely bacterial  Start cefdinir 300 mg BID x 10 day Promethazine-DM cough syrup Zofran as needed for nausea otc cold medications Rest, fluid Call if no improvement

## 2022-10-13 NOTE — Progress Notes (Unsigned)
Subjective:    Patient ID: Katelyn Horne, female    DOB: 08-Oct-1970, 52 y.o.   MRN: [id number]      HPI Camily is here for a Physical exam and her chronic medical problems.    Still on wegovy - taking it every two weeks.  She is still losing weight.    Medications and allergies reviewed with patient and updated if appropriate.  Current Outpatient Medications on File Prior to Visit  Medication Sig Dispense Refill   Multiple Vitamin (MULTIVITAMIN) capsule Take 1 capsule by mouth daily.     Probiotic Product (PROBIOTIC ADVANCED PO) Take by mouth.     Semaglutide-Weight Management (WEGOVY) 2.4 MG/0.75ML SOAJ Inject 2.4 mg into the skin.     vitamin C (ASCORBIC ACID) 250 MG tablet Take 250 mg by mouth daily.     No current facility-administered medications on file prior to visit.    Review of Systems  Constitutional:  Negative for fever.  Eyes:  Negative for visual disturbance.  Respiratory:  Negative for cough, shortness of breath and wheezing.   Cardiovascular:  Negative for chest pain, palpitations and leg swelling.  Gastrointestinal:  Positive for diarrhea (with wegovy - 1-2 times then resolves). Negative for abdominal pain, blood in stool and constipation.       No gerd  Genitourinary:  Negative for dysuria.  Musculoskeletal:  Negative for arthralgias and back pain.  Skin:  Negative for rash.  Neurological:  Negative for light-headedness and headaches.  Psychiatric/Behavioral:  Negative for dysphoric mood and sleep disturbance. The patient is not nervous/anxious.        Objective:   Vitals:   10/14/22 0753  BP: 122/82  Pulse: (!) 57  Temp: 98.1 F (36.7 C)  SpO2: 97%   Filed Weights   10/14/22 0753  Weight: 195 lb (88.5 kg)   Body mass index is 27.98 kg/m.  BP Readings from Last 3 Encounters:  10/14/22 122/82  08/16/22 122/80  03/01/22 110/82    Wt Readings from Last 3 Encounters:  10/14/22 195 lb (88.5 kg)  08/16/22 203 lb (92.1 kg)   03/01/22 224 lb (101.6 kg)       Physical Exam Constitutional: She appears well-developed and well-nourished. No distress.  HENT:  Head: Normocephalic and atraumatic.  Right Ear: External ear normal. Normal ear canal and TM Left Ear: External ear normal.  Normal ear canal and TM Mouth/Throat: Oropharynx is clear and moist.  Eyes: Conjunctivae normal.  Neck: Neck supple. No tracheal deviation present. No thyromegaly present.  No carotid bruit  Cardiovascular: Normal rate, regular rhythm and normal heart sounds.   No murmur heard.  No edema. Pulmonary/Chest: Effort normal and breath sounds normal. No respiratory distress. She has no wheezes. She has no rales.  Breast: deferred   Abdominal: Soft. She exhibits no distension. There is no tenderness.  Lymphadenopathy: She has no cervical adenopathy.  Skin: Skin is warm and dry. She is not diaphoretic.  Psychiatric: She has a normal mood and affect. Her behavior is normal.     No results found for: "WBC", "HGB", "HCT", "PLT", "GLUCOSE", "CHOL", "TRIG", "HDL", "LDLDIRECT", "LDLCALC", "ALT", "AST", "NA", "K", "CL", "CREATININE", "BUN", "CO2", "TSH", "PSA", "INR", "GLUF", "HGBA1C", "MICROALBUR"       Assessment & Plan:   Physical exam: Screening blood work  ordered Exercise  regular  Weight  working on weight loss Substance abuse  none   Reviewed recommended immunizations.   Health Maintenance  Topic Date Due   HIV  Screening  Never done   Hepatitis C Screening  Never done   PAP SMEAR-Modifier  Never done   COLONOSCOPY (Pts 45-9yrs Insurance coverage will need to be confirmed)  Never done   COVID-19 Vaccine (1) 10/30/2022 (Originally 07/14/1971)   Zoster Vaccines- Shingrix (1 of 2) 01/13/2023 (Originally 01/10/2021)   INFLUENZA VACCINE  02/06/2023   MAMMOGRAM  02/10/2024   DTaP/Tdap/Td (2 - Tdap) 08/31/2031   HPV VACCINES  Aged Out          See Problem List for Assessment and Plan of chronic medical  problems.

## 2022-10-13 NOTE — Patient Instructions (Addendum)
Blood work was ordered.   The lab is on the first floor.    Medications changes include :   none    A referral was ordered for GI.     Someone will call you to schedule an appointment.    Return in about 1 year (around 10/14/2023) for Physical Exam.    Health Maintenance, Female Adopting a healthy lifestyle and getting preventive care are important in promoting health and wellness. Ask your health care provider about: The right schedule for you to have regular tests and exams. Things you can do on your own to prevent diseases and keep yourself healthy. What should I know about diet, weight, and exercise? Eat a healthy diet  Eat a diet that includes plenty of vegetables, fruits, low-fat dairy products, and lean protein. Do not eat a lot of foods that are high in solid fats, added sugars, or sodium. Maintain a healthy weight Body mass index (BMI) is used to identify weight problems. It estimates body fat based on height and weight. Your health care provider can help determine your BMI and help you achieve or maintain a healthy weight. Get regular exercise Get regular exercise. This is one of the most important things you can do for your health. Most adults should: Exercise for at least 150 minutes each week. The exercise should increase your heart rate and make you sweat (moderate-intensity exercise). Do strengthening exercises at least twice a week. This is in addition to the moderate-intensity exercise. Spend less time sitting. Even light physical activity can be beneficial. Watch cholesterol and blood lipids Have your blood tested for lipids and cholesterol at 52 years of age, then have this test every 5 years. Have your cholesterol levels checked more often if: Your lipid or cholesterol levels are high. You are older than 52 years of age. You are at high risk for heart disease. What should I know about cancer screening? Depending on your health history and family  history, you may need to have cancer screening at various ages. This may include screening for: Breast cancer. Cervical cancer. Colorectal cancer. Skin cancer. Lung cancer. What should I know about heart disease, diabetes, and high blood pressure? Blood pressure and heart disease High blood pressure causes heart disease and increases the risk of stroke. This is more likely to develop in people who have high blood pressure readings or are overweight. Have your blood pressure checked: Every 3-5 years if you are 77-2 years of age. Every year if you are 6 years old or older. Diabetes Have regular diabetes screenings. This checks your fasting blood sugar level. Have the screening done: Once every three years after age 38 if you are at a normal weight and have a low risk for diabetes. More often and at a younger age if you are overweight or have a high risk for diabetes. What should I know about preventing infection? Hepatitis B If you have a higher risk for hepatitis B, you should be screened for this virus. Talk with your health care provider to find out if you are at risk for hepatitis B infection. Hepatitis C Testing is recommended for: Everyone born from [birth year] through 1965. Anyone with known risk factors for hepatitis C. Sexually transmitted infections (STIs) Get screened for STIs, including gonorrhea and chlamydia, if: You are sexually active and are younger than 52 years of age. You are older than 52 years of age and your health care provider tells you that you are  at risk for this type of infection. Your sexual activity has changed since you were last screened, and you are at increased risk for chlamydia or gonorrhea. Ask your health care provider if you are at risk. Ask your health care provider about whether you are at high risk for HIV. Your health care provider may recommend a prescription medicine to help prevent HIV infection. If you choose to take medicine to prevent HIV, you  should first get tested for HIV. You should then be tested every 3 months for as long as you are taking the medicine. Pregnancy If you are about to stop having your period (premenopausal) and you may become pregnant, seek counseling before you get pregnant. Take 400 to 800 micrograms (mcg) of folic acid every day if you become pregnant. Ask for birth control (contraception) if you want to prevent pregnancy. Osteoporosis and menopause Osteoporosis is a disease in which the bones lose minerals and strength with aging. This can result in bone fractures. If you are 16 years old or older, or if you are at risk for osteoporosis and fractures, ask your health care provider if you should: Be screened for bone loss. Take a calcium or vitamin D supplement to lower your risk of fractures. Be given hormone replacement therapy (HRT) to treat symptoms of menopause. Follow these instructions at home: Alcohol use Do not drink alcohol if: Your health care provider tells you not to drink. You are pregnant, may be pregnant, or are planning to become pregnant. If you drink alcohol: Limit how much you have to: 0-1 drink a day. Know how much alcohol is in your drink. In the [place] one drink equals one 12 oz bottle of beer (355 mL), one 5 oz glass of wine (148 mL), or one 1 oz glass of hard liquor (44 mL). Lifestyle Do not use any products that contain nicotine or tobacco. These products include cigarettes, chewing tobacco, and vaping devices, such as e-cigarettes. If you need help quitting, ask your health care provider. Do not use street drugs. Do not share needles. Ask your health care provider for help if you need support or information about quitting drugs. General instructions Schedule regular health, dental, and eye exams. Stay current with your vaccines. Tell your health care provider if: You often feel depressed. You have ever been abused or do not feel safe at home. Summary Adopting a healthy  lifestyle and getting preventive care are important in promoting health and wellness. Follow your health care provider's instructions about healthy diet, exercising, and getting tested or screened for diseases. Follow your health care provider's instructions on monitoring your cholesterol and blood pressure. This information is not intended to replace advice given to you by your health care provider. Make sure you discuss any questions you have with your health care provider. Document Revised: 11/13/2020 Document Reviewed: 11/13/2020 Elsevier Patient Education  [place].

## 2022-10-14 ENCOUNTER — Encounter: Payer: Self-pay | Admitting: Internal Medicine

## 2022-10-14 ENCOUNTER — Ambulatory Visit (INDEPENDENT_AMBULATORY_CARE_PROVIDER_SITE_OTHER): Payer: BC Managed Care – PPO | Admitting: Internal Medicine

## 2022-10-14 VITALS — BP 122/82 | HR 57 | Temp 98.1°F | Ht 70.0 in | Wt 195.0 lb

## 2022-10-14 DIAGNOSIS — E6609 Other obesity due to excess calories: Secondary | ICD-10-CM

## 2022-10-14 DIAGNOSIS — E669 Obesity, unspecified: Secondary | ICD-10-CM

## 2022-10-14 DIAGNOSIS — Z0000 Encounter for general adult medical examination without abnormal findings: Secondary | ICD-10-CM

## 2022-10-14 DIAGNOSIS — Z114 Encounter for screening for human immunodeficiency virus [HIV]: Secondary | ICD-10-CM

## 2022-10-14 DIAGNOSIS — Z1159 Encounter for screening for other viral diseases: Secondary | ICD-10-CM | POA: Diagnosis not present

## 2022-10-14 DIAGNOSIS — Z1211 Encounter for screening for malignant neoplasm of colon: Secondary | ICD-10-CM

## 2022-10-14 LAB — CBC WITH DIFFERENTIAL/PLATELET
Basophils Absolute: 0.1 10*3/uL (ref 0.0–0.1)
Basophils Relative: 1.7 % (ref 0.0–3.0)
Eosinophils Absolute: 0.1 10*3/uL (ref 0.0–0.7)
Eosinophils Relative: 2.1 % (ref 0.0–5.0)
HCT: 40.7 % (ref 36.0–46.0)
Hemoglobin: 13.9 g/dL (ref 12.0–15.0)
Lymphocytes Relative: 32.7 % (ref 12.0–46.0)
Lymphs Abs: 1.5 10*3/uL (ref 0.7–4.0)
MCHC: 34.1 g/dL (ref 30.0–36.0)
MCV: 86.2 fl (ref 78.0–100.0)
Monocytes Absolute: 0.4 10*3/uL (ref 0.1–1.0)
Monocytes Relative: 7.6 % (ref 3.0–12.0)
Neutro Abs: 2.6 10*3/uL (ref 1.4–7.7)
Neutrophils Relative %: 55.9 % (ref 43.0–77.0)
Platelets: 254 10*3/uL (ref 150.0–400.0)
RBC: 4.73 Mil/uL (ref 3.87–5.11)
RDW: 12.7 % (ref 11.5–15.5)
WBC: 4.7 10*3/uL (ref 4.0–10.5)

## 2022-10-14 LAB — COMPREHENSIVE METABOLIC PANEL
ALT: 8 U/L (ref 0–35)
AST: 16 U/L (ref 0–37)
Albumin: 4.5 g/dL (ref 3.5–5.2)
Alkaline Phosphatase: 68 U/L (ref 39–117)
BUN: 11 mg/dL (ref 6–23)
CO2: 28 mEq/L (ref 19–32)
Calcium: 9.9 mg/dL (ref 8.4–10.5)
Chloride: 102 mEq/L (ref 96–112)
Creatinine, Ser: 0.71 mg/dL (ref 0.40–1.20)
GFR: 98.22 mL/min (ref >60.00)
Glucose, Bld: 85 mg/dL (ref 70–99)
Potassium: 3.9 mEq/L (ref 3.5–5.1)
Sodium: 138 mEq/L (ref 135–145)
Total Bilirubin: 0.7 mg/dL (ref 0.2–1.2)
Total Protein: 7 g/dL (ref 6.0–8.3)

## 2022-10-14 LAB — LIPID PANEL
Cholesterol: 153 mg/dL (ref 0–200)
HDL: 62.9 mg/dL (ref >39.00)
LDL Cholesterol: 81 mg/dL (ref 0–99)
NonHDL: 90.01
Total CHOL/HDL Ratio: 2
Triglycerides: 47 mg/dL (ref 0.0–149.0)
VLDL: 9.4 mg/dL (ref 0.0–40.0)

## 2022-10-14 LAB — TSH: TSH: 1.12 u[IU]/mL (ref 0.35–5.50)

## 2022-10-14 NOTE — Assessment & Plan Note (Addendum)
Chronic Was on wegovy and lost about 60 lbs - no longer covered by insurance - but still taking what she has left over Q 2 weeks which is still helping Wonders what options there are now - discussed phentermine, qsymia and medspas She is exercising regularly Eating protein, veges, taking MVI Check lipid panel, CBC, CMP, TSH

## 2022-10-15 LAB — HIV ANTIBODY (ROUTINE TESTING W REFLEX): HIV 1&2 Ab, 4th Generation: NONREACTIVE

## 2022-10-15 LAB — HEPATITIS C ANTIBODY: Hepatitis C Ab: NONREACTIVE

## 2022-10-24 ENCOUNTER — Telehealth: Payer: Self-pay | Admitting: Gastroenterology

## 2022-10-24 NOTE — Telephone Encounter (Signed)
Hi Dr. Barron Alvine,  Supervising Provider: 0418/2024-PM   We received a referral for patient to have a colonoscopy. The patient is requesting a transfer due to residing in [place] now. She does have GI history in [place] last seen in June of 2021 and had a colonoscopy as well. Records were obtained and scanned into [employer]  for you to review and advise on scheduling.   Thank you

## 2022-10-30 ENCOUNTER — Encounter: Payer: Self-pay | Admitting: Gastroenterology

## 2022-10-30 NOTE — Telephone Encounter (Signed)
Called patient to schedule left voicemail. 

## 2023-01-28 ENCOUNTER — Encounter: Payer: Self-pay | Admitting: Gastroenterology

## 2023-01-28 ENCOUNTER — Ambulatory Visit: Payer: BC Managed Care – PPO | Admitting: Gastroenterology

## 2023-01-28 VITALS — BP 122/74 | HR 67 | Ht 70.0 in | Wt 201.1 lb

## 2023-01-28 DIAGNOSIS — Z7189 Other specified counseling: Secondary | ICD-10-CM | POA: Diagnosis not present

## 2023-01-28 DIAGNOSIS — Z1211 Encounter for screening for malignant neoplasm of colon: Secondary | ICD-10-CM | POA: Diagnosis not present

## 2023-01-28 DIAGNOSIS — K5909 Other constipation: Secondary | ICD-10-CM

## 2023-01-28 DIAGNOSIS — K5730 Diverticulosis of large intestine without perforation or abscess without bleeding: Secondary | ICD-10-CM | POA: Diagnosis not present

## 2023-01-28 MED ORDER — NA SULFATE-K SULFATE-MG SULF 17.5-3.13-1.6 GM/177ML PO SOLN: 1.0000 | Freq: Once | ORAL | 0 refills | Status: AC

## 2023-01-28 NOTE — Patient Instructions (Addendum)
Take Miralax 1 capful daily.  You have been scheduled for a colonoscopy. Please follow written instructions given to you at your visit today.    Please pick up your prep supplies at the pharmacy within the next 1-3 days.  If you use inhalers (even only as needed), please bring them with you on the day of your procedure.  DO NOT TAKE 7 DAYS PRIOR TO TEST- Trulicity (dulaglutide) Ozempic, Wegovy (semaglutide) Mounjaro (tirzepatide) Bydureon Bcise (exanatide extended release)  DO NOT TAKE 1 DAY PRIOR TO YOUR TEST Rybelsus (semaglutide) Adlyxin (lixisenatide) Victoza (liraglutide) Byetta (exanatide)   _______________________________________________________  If your blood pressure at your visit was 140/90 or greater, please contact your primary care physician to follow up on this.  _______________________________________________________  If you are age 25 or younger, your body mass index should be between 19-25. Your Body mass index is 28.86 kg/m. If this is out of the aformentioned range listed, please consider follow up with your Primary Care Provider.   __________________________________________________________  The [facility] providers would like to encourage you to use [facility] to communicate with providers for non-urgent requests or questions.  Due to long hold times on the telephone, sending your provider a message by [facility] may be a faster and more efficient way to get a response.  Please allow 48 business hours for a response.  Please remember that this is for non-urgent requests.   Due to recent changes in healthcare laws, you may see the results of your imaging and laboratory studies on MyChart before your provider has had a chance to review them.  We understand that in some cases there may be results that are confusing or concerning to you. Not all laboratory results come back in the same time frame and the provider may be waiting for multiple results in order to interpret  others.  Please give [place] 48 hours in order for your provider to thoroughly review all the results before contacting the office for clarification of your results.    ___________________________________________________________________________

## 2023-01-28 NOTE — Progress Notes (Signed)
Chief Complaint: Colon cancer screening, discuss colonoscopy, constipation   Referring Provider:     Pincus Sanes, MD   HPI:     Katelyn Horne is a 52 y.o. female with medical history as below, referred to the Gastroenterology Clinic for evaluation of colonoscopy for ongoing CRC screening and evaluation of constipation.  Previously reviewed over 40 pages of notes from previous GI in [place]. - 1997: Cholecystectomy  - 2003: Colonoscopy notable for ulcerative colitis per patient and one of the clinic notes, but no record of this procedure in available notes  - 08/11/2016: Colonoscopy: Area of focal inflammation and a redundant hepatic flexure with a single mucosal ulcer.  There was also hemorrhagic inflammation of a patchy nature.  Area of inflammation in the distal sigmoid colon.  Remainder of the colon was normal.  Path report not available for this study.  - 11/18/2019: CT A/P: Mild circumferential wall thickening in the sigmoid colon which most likely reflects low-grade colitis.  Trace ascites. Normal liver, spleen, pancreas. h/o ccy  - 12/2019: EGD: Normal.  Gastric/duodenal biopsies benign  - 12/22/2019: Colonoscopy: Diffuse area of mildly erythematous mucosa in the sigmoid colon (path benign), 6 mm inflammatory sigmoid colon polyp, excessive looping and sigmoid colon was "matted down suggestive of severe pelvic adhesions".  Random biopsies throughout the colon also benign  - 12/28/2019: Follow-up in the GI clinic in [facility], [place] for abdominal pain and loose stools alternating with constipation.  Has had similar GI symptoms since 2018.  Was recommended to continue Metamucil, MiraLAX.  Was intolerant to Linzess.  Notes also vaguely mention a "history of Ulcerative Colitis diagnosed in 2003. Plan for colonoscopy when stable and may need steroids and/or mesalamine"??  - 01/17/2020: Evaluated in surgical clinic for diverticulitis  - 02/10/2020: Laparoscopic sigmoid resection for  diverticulitis   She states she has a longstanding history of chronic constipation.  Will have episodic overflow diarrhea.  Has had issues with fair/suboptimal bowel prep in the past requiring multiple preps to achieve successful colonoscopy.  Has trialed Linzess, but stopped due to significant abdominal cramping.  Will very rarely use MiraLAX, but she admits she could benefit from using that daily.    Does have occasional lower abdominal cramping, but that is much improved after her previous segmental sigmoid resection in 2021.  Currently taking Wegovy, although no longer covered by insurance and only 1 dose left.   Per patient, may have been on mesalamine for a few years in 2003 for colitis.   Fhx: - MGF- colon cancer - PGF- colon cancer - Mother- dysphagia issues - No known Fhx of IBD      Latest Ref Rng & Units 10/14/2022    8:38 AM  CBC  WBC 4.0 - 10.5 K/uL 4.7   Hemoglobin 12.0 - 15.0 g/dL 65.7   Hematocrit 84.6 - 46.0 % 40.7   Platelets 150.0 - 400.0 K/uL 254.0       Latest Ref Rng & Units 10/14/2022    8:38 AM  CMP  Glucose 70 - 99 mg/dL 85   BUN 6 - 23 mg/dL 11   Creatinine 9.62 - 1.20 mg/dL 9.52   Sodium 841 - 324 mEq/L 138   Potassium 3.5 - 5.1 mEq/L 3.9   Chloride 96 - 112 mEq/L 102   CO2 19 - 32 mEq/L 28   Calcium 8.4 - 10.5 mg/dL 9.9   Total Protein 6.0 - 8.3  g/dL 7.0   Total Bilirubin 0.2 - 1.2 mg/dL 0.7   Alkaline Phos 39 - 117 U/L 68   AST 0 - 37 U/L 16   ALT 0 - 35 U/L 8       Past Surgical History:  Procedure Laterality Date   CESAREAN SECTION  2013   FOOT NEUROMA SURGERY  02/2022   GALLBLADDER SURGERY  1997   hammer toes  02/2022   MENISCUS REPAIR     SIGMOIDECTOMY  2021   TONSILLECTOMY  1978   Family History  Problem Relation Age of Onset   Cancer Mother 51       breast   Dementia Father    Cancer Maternal Grandmother        breast   Colon cancer Maternal Grandfather    Dementia Paternal Grandmother    Colon cancer Paternal  Grandfather    Stomach cancer Paternal Uncle    Pancreatic cancer Paternal Uncle    Social History   Tobacco Use   Smoking status: Never    Passive exposure: Past   Smokeless tobacco: Never  Vaping Use   Vaping status: Never Used  Substance Use Topics   Alcohol use: Not Currently   Drug use: Not Currently   Current Outpatient Medications  Medication Sig Dispense Refill   Multiple Vitamin (MULTIVITAMIN) capsule Take 1 capsule by mouth daily.     Probiotic Product (PROBIOTIC ADVANCED PO) Take by mouth.     Semaglutide-Weight Management (WEGOVY) 2.4 MG/0.75ML SOAJ Inject 2.4 mg into the skin.     vitamin C (ASCORBIC ACID) 250 MG tablet Take 250 mg by mouth daily.     No current facility-administered medications for this visit.   Allergies  Allergen Reactions   Amoxicillin-Pot Clavulanate Hives, Rash and Other (See Comments)   Iodinated Contrast Media Rash   Iodine-131 Rash     Review of Systems: All systems reviewed and negative except where noted in HPI.     Physical Exam:    Wt Readings from Last 3 Encounters:  01/28/23 201 lb 2 oz (91.2 kg)  10/14/22 195 lb (88.5 kg)  08/16/22 203 lb (92.1 kg)    BP 122/74 (BP Location: Left Arm, Patient Position: Sitting, Cuff Size: Normal)   Pulse 67   Ht 5\' 10"  (1.778 m)   Wt 201 lb 2 oz (91.2 kg)   SpO2 97%   BMI 28.86 kg/m  Constitutional:  Pleasant, in no acute distress. Psychiatric: Normal mood and affect. Behavior is normal. Neurological: Alert and oriented to person place and time. Skin: Skin is warm and dry. No rashes noted.   ASSESSMENT AND PLAN;   1) Chronic constipation 2) Lower abdominal cramping 3) Diverticulosis 4) History of segmental sigmoid resection 5) History of colitis - Start MiraLAX 1 cap daily and titrate to soft stools without straining to have BM - Maintain adequate hydration - Discussed potential medication ADR affect of Wegovy with regards to GI tract - Colonoscopy to evaluate for any  ongoing colitis, evaluate previous surgical anastomosis, and routine CRC screening - Unclear if her previous colitis was SCAD, infectious, IBD, etc.  Will evaluate for mucosal pathology with colonoscopy as above - Plan for extended 2-day bowel prep - Plan for pediatric colonoscope given prior surgical history and increased risk of adhesions given previous colonoscopy description - If ongoing constipation, will consider trial of Trulance, Motegrity, Ibsrela, etc  6) Colon cancer screening - Colonoscopy as above  7) Medication counseling - Hold Wegovy 1 week  prior to starting bowel prep   The indications, risks, and benefits of colonoscopy were explained to the patient in detail. Risks include but are not limited to bleeding, perforation, adverse reaction to medications, and cardiopulmonary compromise. Sequelae include but are not limited to the possibility of surgery, hospitalization, and mortality. The patient verbalized understanding and wished to proceed. All questions answered, referred to the scheduler and bowel prep ordered. Further recommendations pending results of the exam.     Shellia Cleverly, DO, FACG  01/28/2023, 9:12 AM   Lawerance Bach, Bobette Mo, MD

## 2023-02-05 ENCOUNTER — Encounter: Payer: Self-pay | Admitting: Gastroenterology

## 2023-02-12 ENCOUNTER — Encounter: Payer: Self-pay | Admitting: Gastroenterology

## 2023-02-12 ENCOUNTER — Ambulatory Visit (AMBULATORY_SURGERY_CENTER): Payer: BC Managed Care – PPO | Admitting: Gastroenterology

## 2023-02-12 VITALS — BP 109/70 | HR 57 | Temp 98.6°F | Resp 13 | Ht 70.0 in | Wt 201.0 lb

## 2023-02-12 DIAGNOSIS — Z1211 Encounter for screening for malignant neoplasm of colon: Secondary | ICD-10-CM | POA: Diagnosis not present

## 2023-02-12 DIAGNOSIS — K5909 Other constipation: Secondary | ICD-10-CM

## 2023-02-12 DIAGNOSIS — D122 Benign neoplasm of ascending colon: Secondary | ICD-10-CM | POA: Diagnosis not present

## 2023-02-12 DIAGNOSIS — K5140 Inflammatory polyps of colon without complications: Secondary | ICD-10-CM

## 2023-02-12 DIAGNOSIS — K529 Noninfective gastroenteritis and colitis, unspecified: Secondary | ICD-10-CM | POA: Diagnosis not present

## 2023-02-12 MED ORDER — SODIUM CHLORIDE 0.9 % IV SOLN: 500.0000 mL | Freq: Once | INTRAVENOUS | Status: DC

## 2023-02-12 NOTE — Progress Notes (Signed)
Called to room to assist during endoscopic procedure.  Patient ID and intended procedure confirmed with present staff. Received instructions for my participation in the procedure from the performing physician.  

## 2023-02-12 NOTE — Progress Notes (Signed)
Pt's states no medical or surgical changes since previsit or office visit. VS assessed by C.W 

## 2023-02-12 NOTE — Patient Instructions (Signed)
Please read handouts provided. Continue present medications. Await pathology results. Return to GI office as needed.   YOU HAD AN ENDOSCOPIC PROCEDURE TODAY AT [facility]:   Refer to the procedure report that was given to you for any specific questions about what was found during the examination.  If the procedure report does not answer your questions, please call your gastroenterologist to clarify.  If you requested that your care partner not be given the details of your procedure findings, then the procedure report has been included in a sealed envelope for you to review at your convenience later.  YOU SHOULD EXPECT: Some feelings of bloating in the abdomen. Passage of more gas than usual.  Walking can help get rid of the air that was put into your GI tract during the procedure and reduce the bloating. If you had a lower endoscopy (such as a colonoscopy or flexible sigmoidoscopy) you may notice spotting of blood in your stool or on the toilet paper. If you underwent a bowel prep for your procedure, you may not have a normal bowel movement for a few days.  Please Note:  You might notice some irritation and congestion in your nose or some drainage.  This is from the oxygen used during your procedure.  There is no need for concern and it should clear up in a day or so.  SYMPTOMS TO REPORT IMMEDIATELY:  Following lower endoscopy (colonoscopy or flexible sigmoidoscopy):  Excessive amounts of blood in the stool  Significant tenderness or worsening of abdominal pains  Swelling of the abdomen that is new, acute  Fever of 100F or higher   For urgent or emergent issues, a gastroenterologist can be reached at any hour by calling [phone]. Do not use MyChart messaging for urgent concerns.    DIET:  We do recommend a small meal at first, but then you may proceed to your regular diet.  Drink plenty of fluids but you should avoid alcoholic beverages for 24 hours.  ACTIVITY:   You should plan to take it easy for the rest of today and you should NOT DRIVE or use heavy machinery until tomorrow (because of the sedation medicines used during the test).    FOLLOW UP: Our staff will call the number listed on your records the next business day following your procedure.  We will call around 7:15- 8:00 am to check on you and address any questions or concerns that you may have regarding the information given to you following your procedure. If we do not reach you, we will leave a message.     If any biopsies were taken you will be contacted by phone or by letter within the next 1-3 weeks.  Please call us at [phone] if you have not heard about the biopsies in 3 weeks.    SIGNATURES/CONFIDENTIALITY: You and/or your care partner have signed paperwork which will be entered into your electronic medical record.  These signatures attest to the fact that that the information above on your After Visit Summary has been reviewed and is understood.  Full responsibility of the confidentiality of this discharge information lies with you and/or your care-partner.

## 2023-02-12 NOTE — Progress Notes (Signed)
Sedate, gd SR, tolerated procedure well, VSS, report to RN 

## 2023-02-12 NOTE — Progress Notes (Signed)
GASTROENTEROLOGY PROCEDURE H&P NOTE   Primary Care Physician: Pincus Sanes, MD    Reason for Procedure:   Constipation, Cramping, Diverticulosis s/p segmental resection, Fhx of colon cancer  Plan:    Colonoscopy  Patient is appropriate for endoscopic procedure(s) in the ambulatory ([facility]) setting.  The nature of the procedure, as well as the risks, benefits, and alternatives were carefully and thoroughly reviewed with the patient. Ample time for discussion and questions allowed. The patient understood, was satisfied, and agreed to proceed.     HPI: Katelyn Horne is a 52 y.o. female who presents for Colonoscopy for evaluation of Lower GI sxs and Fhx of colon cancer .  Patient was most recently seen in the Gastroenterology Clinic on 01/28/2023.  No interval change in medical history since that appointment. Please refer to that note for full details regarding GI history and clinical presentation.   History reviewed. No pertinent past medical history.  Past Surgical History:  Procedure Laterality Date   CESAREAN SECTION  2013   FOOT NEUROMA SURGERY  02/2022   GALLBLADDER SURGERY  1997   hammer toes  02/2022   MENISCUS REPAIR     SIGMOIDECTOMY  2021   TONSILLECTOMY  1978    Prior to Admission medications   Medication Sig Start Date End Date Taking? Authorizing Provider  Multiple Vitamin (MULTIVITAMIN) capsule Take 1 capsule by mouth daily.   Yes [provider]  Probiotic Product (PROBIOTIC ADVANCED PO) Take by mouth.   Yes [provider]  Semaglutide-Weight Management (WEGOVY) 2.4 MG/0.75ML SOAJ Inject 2.4 mg into the skin.    [provider]  vitamin C (ASCORBIC ACID) 250 MG tablet Take 250 mg by mouth daily.    [provider]    Current Outpatient Medications  Medication Sig Dispense Refill   Multiple Vitamin (MULTIVITAMIN) capsule Take 1 capsule by mouth daily.     Probiotic Product (PROBIOTIC ADVANCED PO) Take by mouth.      Semaglutide-Weight Management (WEGOVY) 2.4 MG/0.75ML SOAJ Inject 2.4 mg into the skin.     vitamin C (ASCORBIC ACID) 250 MG tablet Take 250 mg by mouth daily.     Current Facility-Administered Medications  Medication Dose Route Frequency Provider Last Rate Last Admin   0.9 %  sodium chloride infusion  500 mL Intravenous Once Phallon Haydu V, DO        Allergies as of 02/12/2023 - Review Complete 02/12/2023  Allergen Reaction Noted   Amoxicillin-pot clavulanate Hives, Rash, and Other (See Comments) 02/18/2006   Iodinated contrast media Rash 02/18/2006   Iodine-131 Rash 02/18/2006    Family History  Problem Relation Age of Onset   Cancer Mother 32       breast   Dementia Father    Cancer Maternal Grandmother        breast   Colon cancer Maternal Grandfather    Dementia Paternal Grandmother    Colon cancer Paternal Grandfather    Stomach cancer Paternal Uncle    Pancreatic cancer Paternal Uncle     Social History   Socioeconomic History   Marital status: Single    Spouse name: Not on file   Number of children: Not on file   Years of education: Not on file   Highest education level: Not on file  Occupational History   Not on file  Tobacco Use   Smoking status: Never    Passive exposure: Past   Smokeless tobacco: Never  Vaping Use   Vaping status: Never  Used  Substance and Sexual Activity   Alcohol use: Not Currently   Drug use: Not Currently   Sexual activity: Not on file  Other Topics Concern   Not on file  Social History Narrative   Not on file   Social Determinants of Health   Financial Resource Strain: Not on file  Food Insecurity: Not on file  Transportation Needs: Not on file  Physical Activity: Not on file  Stress: Not on file  Social Connections: Not on file  Intimate Partner Violence: Not on file    Physical Exam: Vital signs in last 24 hours: @BP  118/76   Pulse 63   Temp 98.6 F (37 C) (Skin)   Ht 5\' 10"  (1.778 m)   Wt 201 lb (91.2 kg)    SpO2 100%   BMI 28.84 kg/m  GEN: NAD EYE: Sclerae anicteric ENT: MMM CV: Non-tachycardic Pulm: CTA b/l GI: Soft, NT/ND NEURO:  Alert & Oriented x 3   Doristine Locks, DO [facility]   02/12/2023 9:25 AM

## 2023-02-12 NOTE — Op Note (Signed)
[facility] Patient Name: Katelyn Horne Procedure Date: 02/12/2023 9:22 AM MRN: [id number] Endoscopist: Doristine Locks , MD, [id number] Age: 52 Referring MD:  Date of Birth: Apr 02, 1971 Gender: Female Account #: [id number] Procedure:                Colonoscopy Indications:              Screening for colorectal malignant neoplasm                           Addiitonally, history of segmental sigmoid                            resection in 02/2020 for diverticulitis. Does have                            constipation, lower abdominal cramping, but overall                            improved since surgery.                           FHx notable for MGF and MGF with colon cancer. Medicines:                Monitored Anesthesia Care Procedure:                Pre-Anesthesia Assessment:                           - Prior to the procedure, a History and Physical                            was performed, and patient medications and                            allergies were reviewed. The patient's tolerance of                            previous anesthesia was also reviewed. The risks                            and benefits of the procedure and the sedation                            options and risks were discussed with the patient.                            All questions were answered, and informed consent                            was obtained. Prior Anticoagulants: The patient has                            taken no anticoagulant or antiplatelet agents. ASA  Grade Assessment: II - A patient with mild systemic                            disease. After reviewing the risks and benefits,                            the patient was deemed in satisfactory condition to                            undergo the procedure.                           After obtaining informed consent, the colonoscope                            was passed under direct vision. Throughout the                             procedure, the patient's blood pressure, pulse, and                            oxygen saturations were monitored continuously. The                            CF [id number] #[id number] was introduced through the anus                            and advanced to the the terminal ileum. The                            colonoscopy was performed without difficulty. The                            patient tolerated the procedure well. The quality                            of the bowel preparation was good. The terminal                            ileum, ileocecal valve, appendiceal orifice, and                            rectum were photographed. Scope In: 9:36:41 AM Scope Out: 9:52:33 AM Scope Withdrawal Time: 0 hours 12 minutes 19 seconds  Total Procedure Duration: 0 hours 15 minutes 52 seconds  Findings:                 The perianal and digital rectal examinations were                            normal.                           A 3 mm polyp was found in the proximal ascending  colon. The polyp was sessile. The polyp was removed                            with a cold snare. Resection and retrieval were                            complete. Estimated blood loss was minimal.                           Localized mild inflammation characterized by                            congestion (edema) and erythema was found at the                            appendiceal orifice. Biopsies were taken with a                            cold forceps for histology. Estimated blood loss                            was minimal.                           There was evidence of a prior end-to-side                            colo-colonic anastomosis in the recto-sigmoid                            colon. This was patent and was characterized by                            healthy appearing mucosa. The anastomosis was                            traversed.                            The mucosa was otherwise normal appearing                            throughout the remainder of the colon.                           The retroflexed view of the distal rectum and anal                            verge was normal and showed no anal or rectal                            abnormalities.                           The terminal ileum appeared normal. Complications:  No immediate complications. Estimated Blood Loss:     Estimated blood loss was minimal. Impression:               - One 3 mm polyp in the proximal ascending colon,                            removed with a cold snare. Resected and retrieved.                           - Localized mild inflammation was found at the                            appendiceal orifice. Biopsied.                           - Patent end-to-side colo-colonic anastomosis,                            characterized by healthy appearing mucosa.                           - Normal mucosa in the entire examined colon.                           - The distal rectum and anal verge are normal on                            retroflexion view.                           - The examined portion of the ileum was normal. Recommendation:           - Patient has a contact number available for                            emergencies. The signs and symptoms of potential                            delayed complications were discussed with the                            patient. Return to normal activities tomorrow.                            Written discharge instructions were provided to the                            patient.                           - Resume previous diet.                           - Continue present medications.                           -  Await pathology results.                           - Repeat colonoscopy for surveillance based on                            pathology results.                           - Return to GI office PRN. Doristine Locks, MD 02/12/2023 10:01:00 AM

## 2023-02-13 ENCOUNTER — Telehealth: Payer: Self-pay

## 2023-02-13 NOTE — Telephone Encounter (Signed)
  Follow up Call-     02/12/2023    8:20 AM  Call back number  Post procedure Call Back phone  # [phone]  Permission to leave phone message Yes     Patient questions:  Do you have a fever, pain , or abdominal swelling? No. Pain Score  0 *  Have you tolerated food without any problems? Yes.    Have you been able to return to your normal activities? Yes.    Do you have any questions about your discharge instructions: Diet   No. Medications  No. Follow up visit  No.  Do you have questions or concerns about your Care? No.  Actions: * If pain score is 4 or above: No action needed, pain <4.

## 2023-02-21 ENCOUNTER — Other Ambulatory Visit: Payer: Self-pay

## 2023-02-21 DIAGNOSIS — K529 Noninfective gastroenteritis and colitis, unspecified: Secondary | ICD-10-CM

## 2023-02-26 ENCOUNTER — Encounter: Payer: Self-pay | Admitting: Internal Medicine

## 2023-02-27 ENCOUNTER — Telehealth: Payer: BC Managed Care – PPO | Admitting: Nurse Practitioner

## 2023-02-27 VITALS — Temp 99.5°F

## 2023-02-27 DIAGNOSIS — U071 COVID-19: Secondary | ICD-10-CM

## 2023-02-27 MED ORDER — PROMETHAZINE-DM 6.25-15 MG/5ML PO SYRP
5.0000 mL | ORAL_SOLUTION | Freq: Two times a day (BID) | ORAL | 0 refills | Status: AC | PRN
Start: 2023-02-27 — End: ?

## 2023-02-27 NOTE — Assessment & Plan Note (Addendum)
Acute Treat with supportive measures due to patient being at low risk for severe COVID-19 as she has no chronic medical conditions. Promethazine dextromethorphan cough syrup prescribed for cough suppression.  Patient told to avoid driving and operating heavy machinery when taking this. Patient told to continue Tylenol and guaifenesin as well as needed. Educated on current isolation guidelines. Call office with questions. Proceed to the hospital if any severe symptoms occur such as shortness of breath.

## 2023-02-27 NOTE — Progress Notes (Signed)
   Established Patient Office Visit  An audio/visual tele-health visit was completed today for this patient. I connected with  Katelyn Horne on 02/27/23 utilizing audio/visual technology and verified that I am speaking with the correct person using two identifiers. The patient was located at their home, and I was located at the office of [facility] at [facility] during the encounter. I discussed the limitations of evaluation and management by telemedicine. The patient expressed understanding and agreed to proceed.     Subjective   Patient ID: Katelyn Horne, female    DOB: Nov 10, 1970  Age: 52 y.o. MRN: [id number]  Chief Complaint  Patient presents with   Covid Positive    Day 2 of symptom onset. Has no chronic medical conditions. Has had covid 19 infection in the past. Denies shortness of breath.  Has chills, reports temperature between 99 and 100, has nasal congestion, sore throat, dry cough, anorexia and nausea, myalgias, and headache.  Has been taking Tylenol and Mucinex as needed.    Review of Systems  Constitutional:  Positive for chills and fever (low-grade).  HENT:  Positive for congestion and sore throat.   Respiratory:  Positive for cough. Negative for sputum production, shortness of breath and wheezing.   Cardiovascular:  Negative for chest pain and palpitations.  Gastrointestinal:  Positive for nausea. Negative for diarrhea and vomiting.       (+) anorexia  Musculoskeletal:  Positive for myalgias.  Neurological:  Positive for headaches.      Objective:     Temp 99.5 F (37.5 C) Comment: 99.0-100.0   Physical Exam Comprehensive physical exam not completed today as office visit was conducted remotely.  No acute distress noted, patient does cough a couple of times during visit but no signs of respiratory distress noted.  Patient was alert and oriented, and appeared to have appropriate judgment.   No results found for any visits on 02/27/23.    The  10-year ASCVD risk score (Arnett DK, et al., 2019) is: 0.7%    Assessment & Plan:   Problem List Items Addressed This Visit       Other   COVID-19 - Primary    Acute Treat with supportive measures due to patient being at low risk for severe COVID-19 as she has no chronic medical conditions. Promethazine dextromethorphan cough syrup prescribed for cough suppression.  Patient told to avoid driving and operating heavy machinery when taking this. Patient told to continue Tylenol and guaifenesin as well as needed. Educated on current isolation guidelines. Call office with questions. Proceed to the hospital if any severe symptoms occur such as shortness of breath.      Relevant Medications   promethazine-dextromethorphan (PROMETHAZINE-DM) 6.25-15 MG/5ML syrup    Return if symptoms worsen or fail to improve.    Katelyn Paddy, NP

## 2023-03-15 IMAGING — MR MR FOOT*R* W/O CM
5 series · 40 of 40 positions shown · non-contrast
Comparison: Right foot radiograph 12/10/2021

CLINICAL DATA: Foot pain, chronic, metatarsalgia Evaluate for
plantar plate tear and hammertoe 2nd/3rd, negative for trauma right
- surgical consideration - xrays obtained today 12/10/21

EXAM:
MRI OF THE RIGHT FOREFOOT WITHOUT CONTRAST
TECHNIQUE: Multiplanar, multisequence MR imaging of the right forefoot was
performed. No intravenous contrast was administered.

[Series 3: T1 · coronal · right · 3.0mm · 0.31mm/px · 12 of 43 slices shown (1 of 2)]
[im 1/43]
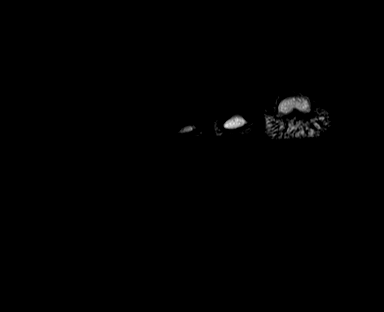
[im 4/43]
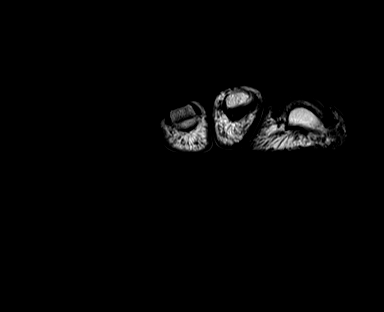
[im 8/43]
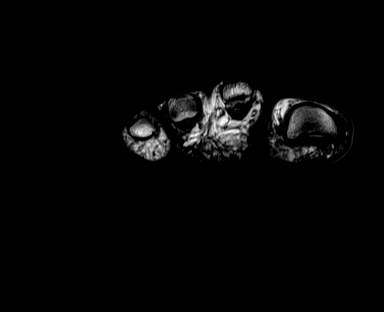
[im 12/43]
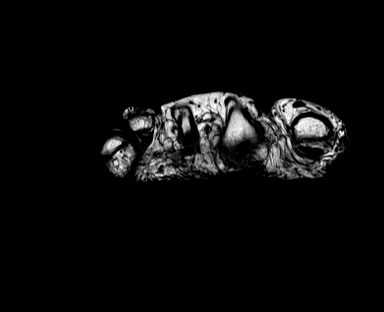
[im 16/43]
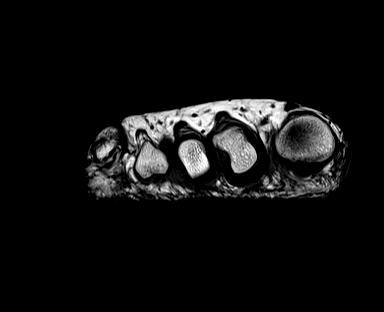
[im 20/43]
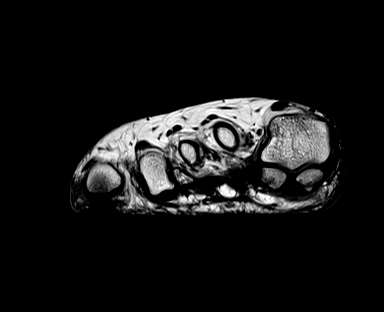
[im 23/43]
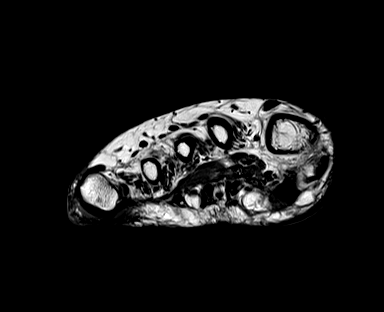
[im 27/43]
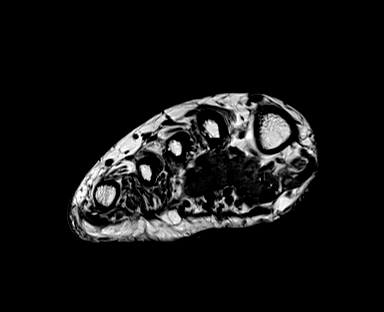
[im 31/43]
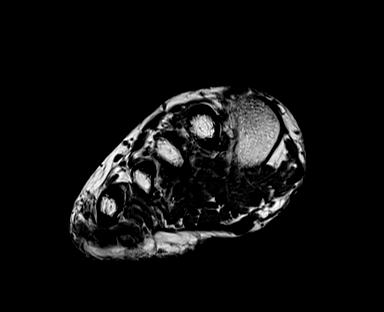
[im 35/43]
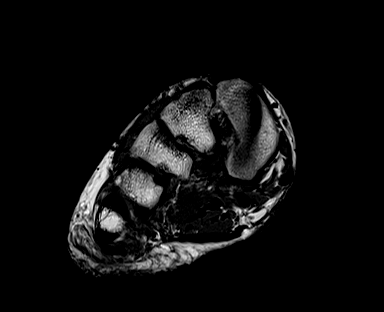
[im 39/43]
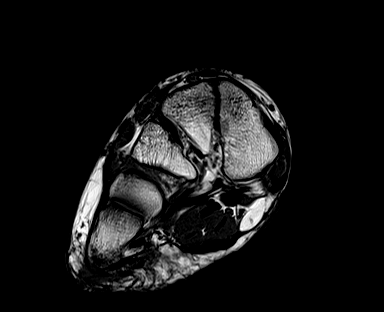
[im 43/43]
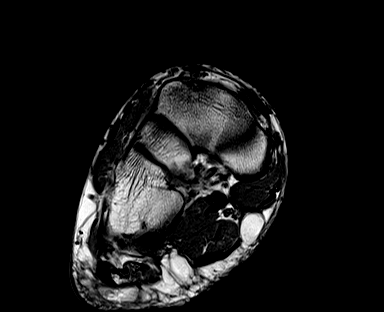

[Series 4: T2 fat-sat · coronal · right · 3.0mm · 0.31mm/px · 11 of 43 slices shown (1 of 2)]
[im 1/43]
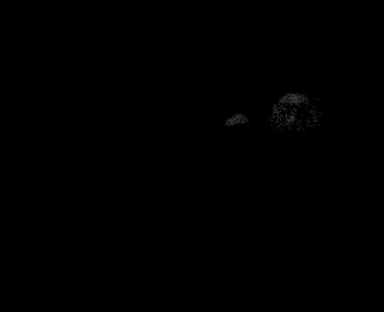
[im 5/43]
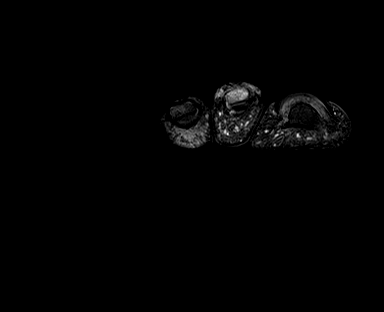
[im 9/43]
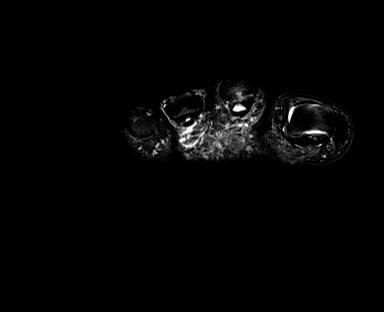
[im 13/43]
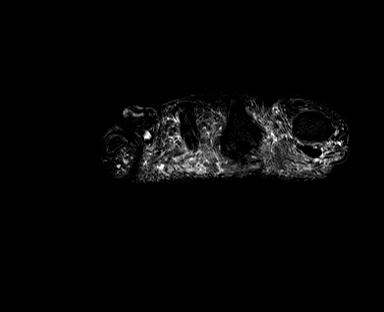
[im 17/43]
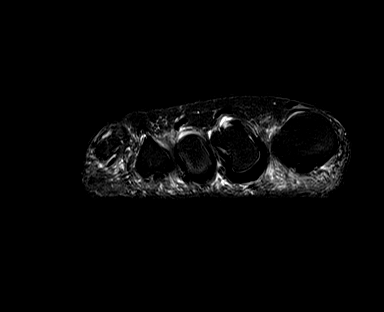
[im 22/43]
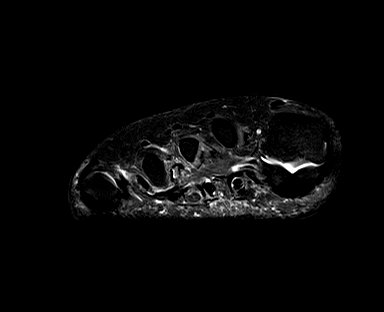
[im 26/43]
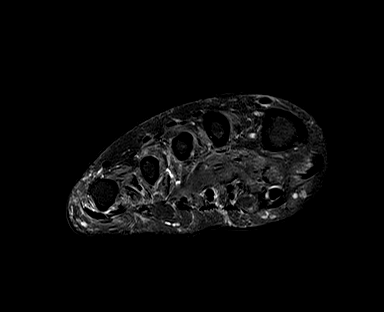
[im 30/43]
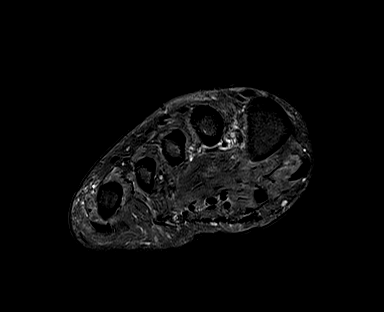
[im 34/43]
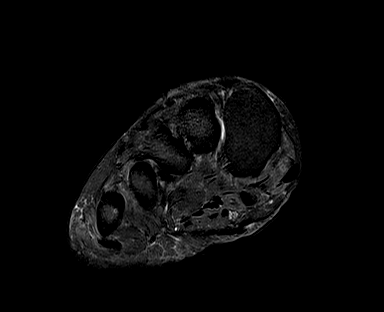
[im 38/43]
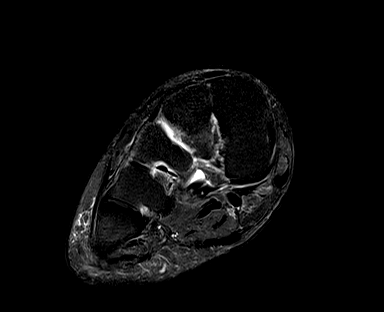
[im 43/43]
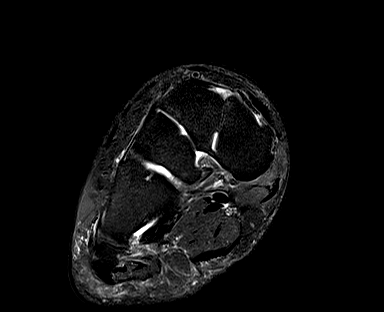

[Series 5: T1 · axial · right · 3.0mm · 0.47mm/px · z∈[-98,-34]mm · 5 of 19 slices shown (2 of 2)]
[im 1/19]
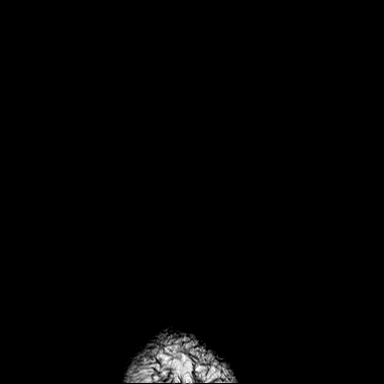
[im 5/19]
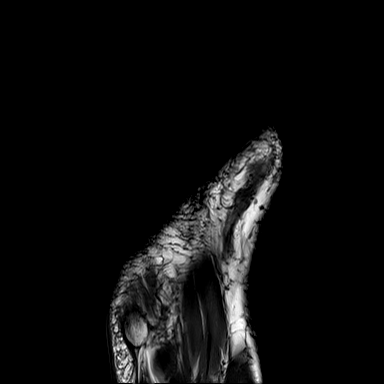
[im 10/19]
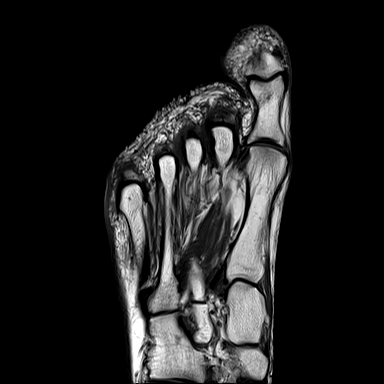
[im 14/19]
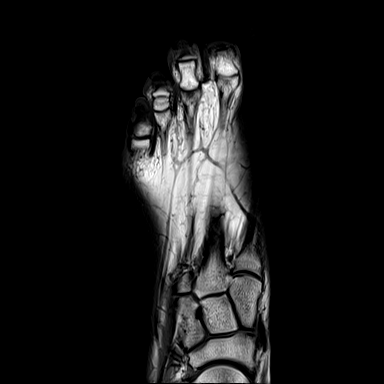
[im 19/19]
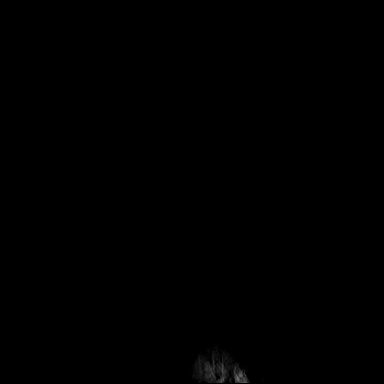

[Series 6: T2 fat-sat · axial · right · 3.0mm · 0.47mm/px · z∈[-98,-34]mm · 5 of 19 slices shown (2 of 2)]
[im 1/19]
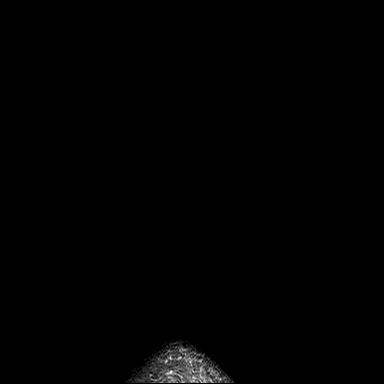
[im 5/19]
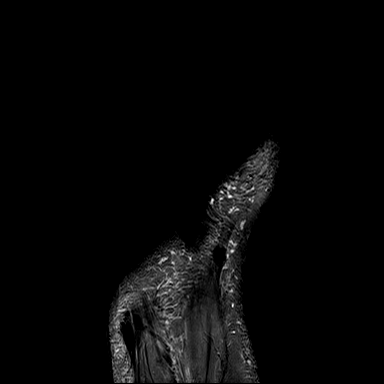
[im 10/19]
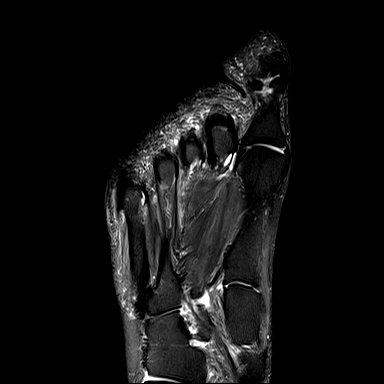
[im 14/19]
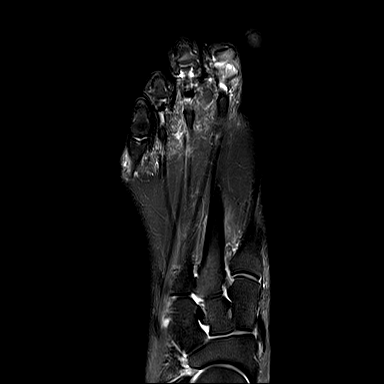
[im 19/19]
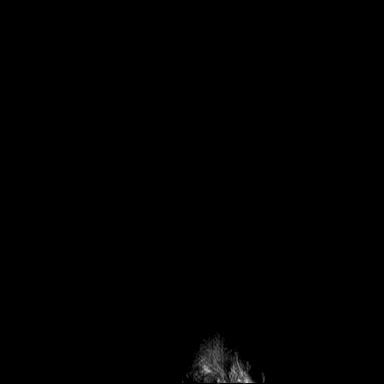

[Series 7: STIR · sagittal · right · 3.0mm · 0.70mm/px · 7 of 26 slices shown]
[im 1/26]
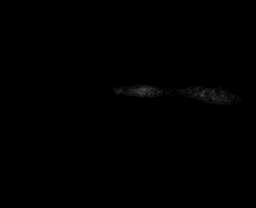
[im 5/26]
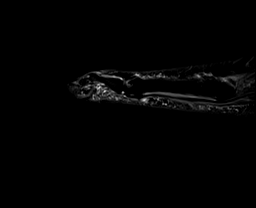
[im 9/26]
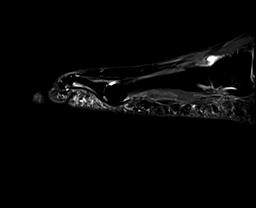
[im 13/26]
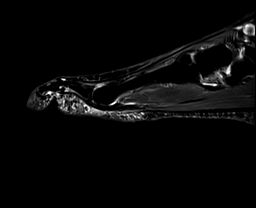
[im 17/26]
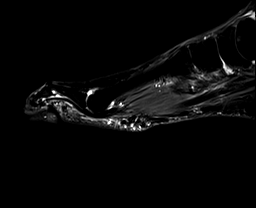
[im 21/26]
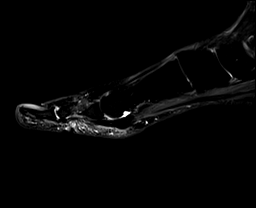
[im 26/26]
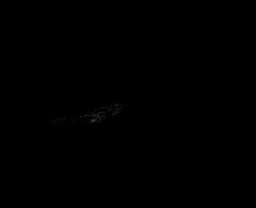

[40 of 40 positions shown; findings below may reference images not displayed]

FINDINGS: Bones/Joint/Cartilage

There is no evidence of fracture. The cortex is intact. There is no
significant marrow signal alteration. There is a hammertoe deformity
of the second and third toes.

Ligaments

Intact Lisfranc ligament. There is a partial plantar plate tear
along the lateral aspect at the third MTP joint (series 7, image
16). No evidence of plantar plate tear involving the second MTP
joint.

Muscles and Tendons

There is mild muscle atrophy in the forefoot. There is no acute
tendon tear.

Soft tissues

There is an intermetatarsal neuroma in the third webspace measuring
1.5 x 0.5 cm are (series 3, image 16). No evidence of
intermetatarsal bursitis.
IMPRESSION: Intermetatarsal neuroma in the third webspace measuring 1.5 x
cm.

Hammertoe deformities of the second and third toes. Partial plantar
plate tear at the third MTP joint.

## 2023-03-20 ENCOUNTER — Other Ambulatory Visit (INDEPENDENT_AMBULATORY_CARE_PROVIDER_SITE_OTHER): Payer: BC Managed Care – PPO

## 2023-03-20 DIAGNOSIS — K529 Noninfective gastroenteritis and colitis, unspecified: Secondary | ICD-10-CM

## 2023-03-20 LAB — SEDIMENTATION RATE: Sed Rate: 10 mm/h (ref 0–30)

## 2023-03-20 LAB — C-REACTIVE PROTEIN: CRP: <1 mg/dL (ref 0.5–20.0)

## 2023-04-06 NOTE — Patient Instructions (Incomplete)
       Medications changes include :   zepbound 2.5mg  weekly

## 2023-04-06 NOTE — Progress Notes (Unsigned)
    Subjective:    Patient ID: [name], female    DOB: 15-Oct-1970, 52 y.o.   MRN: [id number]      HPI Coretha is here for No chief complaint on file.        Medications and allergies reviewed with patient and updated if appropriate.  Current Outpatient Medications on File Prior to Visit  Medication Sig Dispense Refill   Multiple Vitamin (MULTIVITAMIN) capsule Take 1 capsule by mouth daily.     Probiotic Product (PROBIOTIC ADVANCED PO) Take by mouth.     vitamin C (ASCORBIC ACID) 250 MG tablet Take 250 mg by mouth daily.     No current facility-administered medications on file prior to visit.    Review of Systems     Objective:  There were no vitals filed for this visit. BP Readings from Last 3 Encounters:  02/12/23 109/70  01/28/23 122/74  10/14/22 122/82   Wt Readings from Last 3 Encounters:  02/12/23 201 lb (91.2 kg)  01/28/23 201 lb 2 oz (91.2 kg)  10/14/22 195 lb (88.5 kg)   There is no height or weight on file to calculate BMI.    Physical Exam         Assessment & Plan:    See Problem List for Assessment and Plan of chronic medical problems.

## 2023-04-07 ENCOUNTER — Ambulatory Visit: Payer: BC Managed Care – PPO | Admitting: Internal Medicine

## 2023-04-07 ENCOUNTER — Encounter: Payer: Self-pay | Admitting: Internal Medicine

## 2023-04-07 VITALS — BP 118/76 | HR 62 | Temp 98.2°F | Ht 70.0 in | Wt 215.0 lb

## 2023-04-07 DIAGNOSIS — Z6830 Body mass index (BMI) 30.0-30.9, adult: Secondary | ICD-10-CM

## 2023-04-07 DIAGNOSIS — E669 Obesity, unspecified: Secondary | ICD-10-CM | POA: Diagnosis not present

## 2023-04-07 MED ORDER — TIRZEPATIDE-WEIGHT MANAGEMENT 2.5 MG/0.5ML ~~LOC~~ SOLN: 2.5000 mg | SUBCUTANEOUS | 0 refills | Status: DC

## 2023-04-07 NOTE — Assessment & Plan Note (Signed)
Chronic Was on Wegovy for a while and did very well with that-lost about 60 pounds.  Her insurance stopped covering it and she has gained weight back-20 pounds in the last few months She feels like she is doing everything right and needs help.  Interested in trying the [employer] prescription for medication that comes in a vial which would be affordable Will electronically send prescription today Reviewed side effects including heartburn, nausea, constipation, diarrhea and increased risk of pancreatitis.  Discussed possibility of long forearm stomach motility issues

## 2023-04-15 MED ORDER — ONDANSETRON HCL 4 MG PO TABS: 4.0000 mg | ORAL_TABLET | Freq: Three times a day (TID) | ORAL | 5 refills | Status: DC | PRN

## 2023-04-16 DIAGNOSIS — G4700 Insomnia, unspecified: Secondary | ICD-10-CM | POA: Diagnosis not present

## 2023-04-16 DIAGNOSIS — Z713 Dietary counseling and surveillance: Secondary | ICD-10-CM | POA: Diagnosis not present

## 2023-04-16 DIAGNOSIS — Z6831 Body mass index (BMI) 31.0-31.9, adult: Secondary | ICD-10-CM | POA: Diagnosis not present

## 2023-04-16 DIAGNOSIS — N3281 Overactive bladder: Secondary | ICD-10-CM | POA: Diagnosis not present

## 2023-05-15 ENCOUNTER — Ambulatory Visit: Payer: BC Managed Care – PPO | Admitting: Gastroenterology

## 2023-05-30 ENCOUNTER — Encounter: Payer: Self-pay | Admitting: Internal Medicine

## 2023-06-01 MED ORDER — TIRZEPATIDE-WEIGHT MANAGEMENT 2.5 MG/0.5ML ~~LOC~~ SOLN: 2.5000 mg | SUBCUTANEOUS | 0 refills | Status: DC

## 2023-06-23 ENCOUNTER — Other Ambulatory Visit: Payer: Self-pay | Admitting: Internal Medicine

## 2023-06-28 DIAGNOSIS — H6693 Otitis media, unspecified, bilateral: Secondary | ICD-10-CM | POA: Diagnosis not present

## 2023-06-28 DIAGNOSIS — B9689 Other specified bacterial agents as the cause of diseases classified elsewhere: Secondary | ICD-10-CM | POA: Diagnosis not present

## 2023-06-28 DIAGNOSIS — J019 Acute sinusitis, unspecified: Secondary | ICD-10-CM | POA: Diagnosis not present

## 2023-07-02 DIAGNOSIS — R059 Cough, unspecified: Secondary | ICD-10-CM | POA: Diagnosis not present

## 2023-07-02 DIAGNOSIS — H66003 Acute suppurative otitis media without spontaneous rupture of ear drum, bilateral: Secondary | ICD-10-CM | POA: Diagnosis not present

## 2023-07-28 ENCOUNTER — Ambulatory Visit
Admission: RE | Admit: 2023-07-28 | Discharge: 2023-07-28 | Disposition: A | Discharge status: Home / Self Care | Payer: BC Managed Care – PPO | Source: Ambulatory Visit | Attending: Student

## 2023-07-28 ENCOUNTER — Other Ambulatory Visit: Payer: Self-pay | Admitting: Student

## 2023-07-28 ENCOUNTER — Ambulatory Visit
Admission: RE | Admit: 2023-07-28 | Discharge: 2023-07-28 | Disposition: A | Discharge status: Home / Self Care | Payer: BC Managed Care – PPO | Attending: Student | Admitting: Student

## 2023-07-28 DIAGNOSIS — J018 Other acute sinusitis: Secondary | ICD-10-CM | POA: Diagnosis not present

## 2023-07-28 DIAGNOSIS — R059 Cough, unspecified: Secondary | ICD-10-CM

## 2023-07-28 DIAGNOSIS — R051 Acute cough: Secondary | ICD-10-CM | POA: Diagnosis not present

## 2023-07-28 DIAGNOSIS — R058 Other specified cough: Secondary | ICD-10-CM | POA: Diagnosis not present

## 2023-07-28 DIAGNOSIS — H6983 Other specified disorders of Eustachian tube, bilateral: Secondary | ICD-10-CM | POA: Diagnosis not present

## 2023-08-05 DIAGNOSIS — L821 Other seborrheic keratosis: Secondary | ICD-10-CM | POA: Diagnosis not present

## 2023-08-05 DIAGNOSIS — D2361 Other benign neoplasm of skin of right upper limb, including shoulder: Secondary | ICD-10-CM | POA: Diagnosis not present

## 2023-08-05 DIAGNOSIS — D2362 Other benign neoplasm of skin of left upper limb, including shoulder: Secondary | ICD-10-CM | POA: Diagnosis not present

## 2023-08-05 DIAGNOSIS — L814 Other melanin hyperpigmentation: Secondary | ICD-10-CM | POA: Diagnosis not present

## 2023-08-14 ENCOUNTER — Other Ambulatory Visit: Payer: Self-pay

## 2023-08-14 ENCOUNTER — Encounter: Payer: Self-pay | Admitting: Internal Medicine

## 2023-08-14 MED ORDER — ZEPBOUND 5 MG/0.5ML ~~LOC~~ SOLN: 5.0000 mg | SUBCUTANEOUS | 2 refills | Status: DC

## 2023-08-18 DIAGNOSIS — J310 Chronic rhinitis: Secondary | ICD-10-CM | POA: Diagnosis not present

## 2023-09-03 MED ORDER — ZEPBOUND 7.5 MG/0.5ML ~~LOC~~ SOAJ: 7.5000 mg | SUBCUTANEOUS | 0 refills | Status: DC

## 2023-09-08 ENCOUNTER — Other Ambulatory Visit: Payer: Self-pay | Admitting: Student

## 2023-09-08 DIAGNOSIS — J310 Chronic rhinitis: Secondary | ICD-10-CM

## 2023-09-15 ENCOUNTER — Encounter: Payer: Self-pay | Admitting: Internal Medicine

## 2023-09-23 MED ORDER — TIRZEPATIDE-WEIGHT MANAGEMENT 7.5 MG/0.5ML ~~LOC~~ SOLN: 7.5000 mg | SUBCUTANEOUS | 5 refills | Status: DC

## 2023-09-23 MED ORDER — TIRZEPATIDE-WEIGHT MANAGEMENT 7.5 MG/0.5ML ~~LOC~~ SOLN: 7.5000 mg | SUBCUTANEOUS | 1 refills | Status: DC

## 2023-09-23 NOTE — Addendum Note (Signed)
 Addended by: Pincus Sanes on: 09/23/2023 09:12 AM   Modules accepted: Orders

## 2023-09-23 NOTE — Addendum Note (Signed)
 Addended by: Pincus Sanes on: 09/23/2023 08:57 PM   Modules accepted: Orders

## 2023-09-29 ENCOUNTER — Encounter: Payer: Self-pay | Admitting: Student

## 2023-09-30 ENCOUNTER — Ambulatory Visit
Admission: RE | Admit: 2023-09-30 | Discharge: 2023-09-30 | Disposition: A | Discharge status: Home / Self Care | Source: Ambulatory Visit | Attending: Student | Admitting: Student

## 2023-09-30 DIAGNOSIS — J310 Chronic rhinitis: Secondary | ICD-10-CM

## 2023-09-30 MED ORDER — GADOPICLENOL 0.5 MMOL/ML IV SOLN
10.0000 mL | Freq: Once | INTRAVENOUS | Status: AC | PRN
  Administered 2023-09-30: 10 mL via INTRAVENOUS

## 2023-10-27 DIAGNOSIS — J302 Other seasonal allergic rhinitis: Secondary | ICD-10-CM | POA: Diagnosis not present

## 2023-10-27 DIAGNOSIS — J300 Vasomotor rhinitis: Secondary | ICD-10-CM | POA: Diagnosis not present

## 2023-10-29 NOTE — Patient Instructions (Addendum)
 Blood work was ordered.       Medications changes include :   None      Return in about 6 months (around 04/30/2024) for follow up.   Health Maintenance, Female Adopting a healthy lifestyle and getting preventive care are important in promoting health and wellness. Ask your health care provider about: The right schedule for you to have regular tests and exams. Things you can do on your own to prevent diseases and keep yourself healthy. What should I know about diet, weight, and exercise? Eat a healthy diet  Eat a diet that includes plenty of vegetables, fruits, low-fat dairy products, and lean protein. Do not eat a lot of foods that are high in solid fats, added sugars, or sodium. Maintain a healthy weight Body mass index (BMI) is used to identify weight problems. It estimates body fat based on height and weight. Your health care provider can help determine your BMI and help you achieve or maintain a healthy weight. Get regular exercise Get regular exercise. This is one of the most important things you can do for your health. Most adults should: Exercise for at least 150 minutes each week. The exercise should increase your heart rate and make you sweat (moderate-intensity exercise). Do strengthening exercises at least twice a week. This is in addition to the moderate-intensity exercise. Spend less time sitting. Even light physical activity can be beneficial. Watch cholesterol and blood lipids Have your blood tested for lipids and cholesterol at 53 years of age, then have this test every 5 years. Have your cholesterol levels checked more often if: Your lipid or cholesterol levels are high. You are older than 53 years of age. You are at high risk for heart disease. What should I know about cancer screening? Depending on your health history and family history, you may need to have cancer screening at various ages. This may include screening for: Breast cancer. Cervical  cancer. Colorectal cancer. Skin cancer. Lung cancer. What should I know about heart disease, diabetes, and high blood pressure? Blood pressure and heart disease High blood pressure causes heart disease and increases the risk of stroke. This is more likely to develop in people who have high blood pressure readings or are overweight. Have your blood pressure checked: Every 3-5 years if you are 67-12 years of age. Every year if you are 59 years old or older. Diabetes Have regular diabetes screenings. This checks your fasting blood sugar level. Have the screening done: Once every three years after age 78 if you are at a normal weight and have a low risk for diabetes. More often and at a younger age if you are overweight or have a high risk for diabetes. What should I know about preventing infection? Hepatitis B If you have a higher risk for hepatitis B, you should be screened for this virus. Talk with your health care provider to find out if you are at risk for hepatitis B infection. Hepatitis C Testing is recommended for: Everyone born from [birth year] through 1965. Anyone with known risk factors for hepatitis C. Sexually transmitted infections (STIs) Get screened for STIs, including gonorrhea and chlamydia, if: You are sexually active and are younger than 53 years of age. You are older than 53 years of age and your health care provider tells you that you are at risk for this type of infection. Your sexual activity has changed since you were last screened, and you are at increased risk for chlamydia or  gonorrhea. Ask your health care provider if you are at risk. Ask your health care provider about whether you are at high risk for HIV. Your health care provider may recommend a prescription medicine to help prevent HIV infection. If you choose to take medicine to prevent HIV, you should first get tested for HIV. You should then be tested every 3 months for as long as you are taking the  medicine. Pregnancy If you are about to stop having your period (premenopausal) and you may become pregnant, seek counseling before you get pregnant. Take 400 to 800 micrograms (mcg) of folic acid every day if you become pregnant. Ask for birth control (contraception) if you want to prevent pregnancy. Osteoporosis and menopause Osteoporosis is a disease in which the bones lose minerals and strength with aging. This can result in bone fractures. If you are 38 years old or older, or if you are at risk for osteoporosis and fractures, ask your health care provider if you should: Be screened for bone loss. Take a calcium or vitamin D supplement to lower your risk of fractures. Be given hormone replacement therapy (HRT) to treat symptoms of menopause. Follow these instructions at home: Alcohol use Do not drink alcohol if: Your health care provider tells you not to drink. You are pregnant, may be pregnant, or are planning to become pregnant. If you drink alcohol: Limit how much you have to: 0-1 drink a day. Know how much alcohol is in your drink. In the [place] one drink equals one 12 oz bottle of beer (355 mL), one 5 oz glass of wine (148 mL), or one 1 oz glass of hard liquor (44 mL). Lifestyle Do not use any products that contain nicotine or tobacco. These products include cigarettes, chewing tobacco, and vaping devices, such as e-cigarettes. If you need help quitting, ask your health care provider. Do not use street drugs. Do not share needles. Ask your health care provider for help if you need support or information about quitting drugs. General instructions Schedule regular health, dental, and eye exams. Stay current with your vaccines. Tell your health care provider if: You often feel depressed. You have ever been abused or do not feel safe at home. Summary Adopting a healthy lifestyle and getting preventive care are important in promoting health and wellness. Follow your health care  provider's instructions about healthy diet, exercising, and getting tested or screened for diseases. Follow your health care provider's instructions on monitoring your cholesterol and blood pressure. This information is not intended to replace advice given to you by your health care provider. Make sure you discuss any questions you have with your health care provider. Document Revised: 11/13/2020 Document Reviewed: 11/13/2020 Elsevier Patient Education  2024 [employer].

## 2023-10-29 NOTE — Progress Notes (Unsigned)
 Subjective:    Patient ID: Katelyn Horne, female    DOB: 03-18-1971, 53 y.o.   MRN: [id number]      HPI Katelyn Horne is here for a Physical exam and her chronic medical problems.   Doing ok - struggling with weight.  She did great with the Wegovy  when she was on it, but insurance stopped covering it.  She has been pain for Zepbound  and that does help suppress her appetite, but she is not losing weight.  She is also having mild side effects.  She would like to try Wegovy  again.   Medications and allergies reviewed with patient and updated if appropriate.  Current Outpatient Medications on File Prior to Visit  Medication Sig Dispense Refill   cetirizine (ZYRTEC) 10 MG tablet Take 10 mg by mouth daily.     fluticasone (FLONASE) 50 MCG/ACT nasal spray Place into both nostrils.     ipratropium (ATROVENT) 0.03 % nasal spray Place into both nostrils.     Multiple Vitamin (MULTIVITAMIN) capsule Take 1 capsule by mouth daily.     ondansetron  (ZOFRAN ) 4 MG tablet Take 1 tablet (4 mg total) by mouth every 8 (eight) hours as needed for nausea or vomiting. 20 tablet 5   Probiotic Product (PROBIOTIC ADVANCED PO) Take by mouth.     tirzepatide  7.5 MG/0.5ML injection vial Inject 7.5 mg into the skin once a week. 2 mL 5   vitamin C (ASCORBIC ACID) 250 MG tablet Take 250 mg by mouth daily.     No current facility-administered medications on file prior to visit.    Review of Systems  Constitutional:  Negative for fever.  HENT:  Positive for congestion, rhinorrhea and sore throat.   Eyes:  Positive for itching. Negative for visual disturbance.  Respiratory:  Negative for cough, shortness of breath and wheezing.   Cardiovascular:  Negative for chest pain, palpitations and leg swelling.  Gastrointestinal:  Positive for constipation. Negative for abdominal pain, blood in stool and diarrhea.       GERD from zepbound   Genitourinary:  Negative for dysuria.  Musculoskeletal:  Positive for arthralgias  (knee with running) and back pain (occ lower back).  Skin:  Negative for rash.  Neurological:  Negative for dizziness, light-headedness, numbness and headaches.  Psychiatric/Behavioral:  Positive for sleep disturbance (wakes a lot a few days a week). Negative for dysphoric mood. The patient is not nervous/anxious.        Objective:   Vitals:   10/30/23 0746  BP: 116/72  Pulse: 85  Temp: 98 F (36.7 C)  SpO2: 97%   Filed Weights   10/30/23 0746  Weight: 242 lb (109.8 kg)   Body mass index is 34.72 kg/m.  BP Readings from Last 3 Encounters:  10/30/23 116/72  04/07/23 118/76  02/12/23 109/70    Wt Readings from Last 3 Encounters:  10/30/23 242 lb (109.8 kg)  04/07/23 215 lb (97.5 kg)  02/12/23 201 lb (91.2 kg)       Physical Exam Constitutional: She appears well-developed and well-nourished. No distress.  HENT:  Head: Normocephalic and atraumatic.  Right Ear: External ear normal. Normal ear canal and TM Left Ear: External ear normal.  Normal ear canal and TM Mouth/Throat: Oropharynx is clear and moist.  Eyes: Conjunctivae normal.  Neck: Neck supple. No tracheal deviation present. No thyromegaly present.  No carotid bruit  Cardiovascular: Normal rate, regular rhythm and normal heart sounds.   No murmur heard.  No edema. Pulmonary/Chest: Effort normal and  breath sounds normal. No respiratory distress. She has no wheezes. She has no rales.  Breast: deferred   Abdominal: Soft. She exhibits no distension. There is no tenderness.  Lymphadenopathy: She has no cervical adenopathy.  Skin: Skin is warm and dry. She is not diaphoretic.  Psychiatric: She has a normal mood and affect. Her behavior is normal.     Lab Results  Component Value Date   WBC 4.7 10/14/2022   HGB 13.9 10/14/2022   HCT 40.7 10/14/2022   PLT 254.0 10/14/2022   GLUCOSE 85 10/14/2022   CHOL 153 10/14/2022   TRIG 47.0 10/14/2022   HDL 62.90 10/14/2022   LDLCALC 81 10/14/2022   ALT 8  10/14/2022   AST 16 10/14/2022   NA 138 10/14/2022   K 3.9 10/14/2022   CL 102 10/14/2022   CREATININE 0.71 10/14/2022   BUN 11 10/14/2022   CO2 28 10/14/2022   TSH 1.12 10/14/2022         Assessment & Plan:   Physical exam: Screening blood work  ordered Exercise  regular - running, weights Weight  working on weight loss Substance abuse  none   Reviewed recommended immunizations.   Health Maintenance  Topic Date Due   Cervical Cancer Screening (HPV/Pap Cotest)  Never done   Zoster Vaccines- Shingrix (1 of 2) Never done   COVID-19 Vaccine (1 - 2024-25 season) 11/14/2023 (Originally 03/09/2023)   INFLUENZA VACCINE  02/06/2024   MAMMOGRAM  02/10/2024   DTaP/Tdap/Td (2 - Tdap) 08/31/2031   Colonoscopy  02/11/2033   Hepatitis C Screening  Completed   HIV Screening  Completed   HPV VACCINES  Aged Out   Meningococcal B Vaccine  Aged Out          See Problem List for Assessment and Plan of chronic medical problems.

## 2023-10-30 ENCOUNTER — Encounter: Payer: Self-pay | Admitting: Internal Medicine

## 2023-10-30 ENCOUNTER — Ambulatory Visit (INDEPENDENT_AMBULATORY_CARE_PROVIDER_SITE_OTHER): Payer: BC Managed Care – PPO | Admitting: Internal Medicine

## 2023-10-30 VITALS — BP 116/72 | HR 85 | Temp 98.0°F | Ht 70.0 in | Wt 242.0 lb

## 2023-10-30 DIAGNOSIS — Z0000 Encounter for general adult medical examination without abnormal findings: Secondary | ICD-10-CM

## 2023-10-30 DIAGNOSIS — E669 Obesity, unspecified: Secondary | ICD-10-CM | POA: Diagnosis not present

## 2023-10-30 DIAGNOSIS — J309 Allergic rhinitis, unspecified: Secondary | ICD-10-CM

## 2023-10-30 LAB — COMPREHENSIVE METABOLIC PANEL WITH GFR
ALT: 10 U/L (ref 0–35)
AST: 18 U/L (ref 0–37)
Albumin: 4.6 g/dL (ref 3.5–5.2)
Alkaline Phosphatase: 74 U/L (ref 39–117)
BUN: 13 mg/dL (ref 6–23)
CO2: 27 meq/L (ref 19–32)
Calcium: 9.5 mg/dL (ref 8.4–10.5)
Chloride: 102 meq/L (ref 96–112)
Creatinine, Ser: 0.71 mg/dL (ref 0.40–1.20)
GFR: 97.5 mL/min (ref >60.00)
Glucose, Bld: 90 mg/dL (ref 70–99)
Potassium: 4 meq/L (ref 3.5–5.1)
Sodium: 135 meq/L (ref 135–145)
Total Bilirubin: 0.7 mg/dL (ref 0.2–1.2)
Total Protein: 7.2 g/dL (ref 6.0–8.3)

## 2023-10-30 LAB — LIPID PANEL
Cholesterol: 167 mg/dL (ref 0–200)
HDL: 73.9 mg/dL (ref >39.00)
LDL Cholesterol: 84 mg/dL (ref 0–99)
NonHDL: 93.2
Total CHOL/HDL Ratio: 2
Triglycerides: 47 mg/dL (ref 0.0–149.0)
VLDL: 9.4 mg/dL (ref 0.0–40.0)

## 2023-10-30 LAB — CBC WITH DIFFERENTIAL/PLATELET
Basophils Absolute: 0.1 10*3/uL (ref 0.0–0.1)
Basophils Relative: 1.9 % (ref 0.0–3.0)
Eosinophils Absolute: 0.1 10*3/uL (ref 0.0–0.7)
Eosinophils Relative: 1.5 % (ref 0.0–5.0)
HCT: 42.2 % (ref 36.0–46.0)
Hemoglobin: 14.4 g/dL (ref 12.0–15.0)
Lymphocytes Relative: 35.8 % (ref 12.0–46.0)
Lymphs Abs: 1.6 10*3/uL (ref 0.7–4.0)
MCHC: 34.2 g/dL (ref 30.0–36.0)
MCV: 86.6 fl (ref 78.0–100.0)
Monocytes Absolute: 0.4 10*3/uL (ref 0.1–1.0)
Monocytes Relative: 7.8 % (ref 3.0–12.0)
Neutro Abs: 2.4 10*3/uL (ref 1.4–7.7)
Neutrophils Relative %: 53 % (ref 43.0–77.0)
Platelets: 238 10*3/uL (ref 150.0–400.0)
RBC: 4.88 Mil/uL (ref 3.87–5.11)
RDW: 12.4 % (ref 11.5–15.5)
WBC: 4.6 10*3/uL (ref 4.0–10.5)

## 2023-10-30 LAB — TSH: TSH: 2.19 u[IU]/mL (ref 0.35–5.50)

## 2023-10-30 MED ORDER — SEMAGLUTIDE-WEIGHT MANAGEMENT 1 MG/0.5ML ~~LOC~~ SOAJ: 1.0000 mg | SUBCUTANEOUS | 0 refills | Status: DC

## 2023-10-30 NOTE — Assessment & Plan Note (Addendum)
 Chronic She currently taking Zepbound  7.5 mg weekly Tolerating medication  but has had some joint pain, GERD and constipation Would like to go back to wegovy  She is exercising regularly She is eating an adequate amount of protein, drinking plenty of water and getting in fruits and vegetables. Stop Zepbound  Start Wegovy  1 mg weekly which she tolerated well-prescription sent to [facility] pharmacy

## 2023-10-30 NOTE — Assessment & Plan Note (Addendum)
 Chronic Worse since being sick in December Currently on Zyrtec, Flonase and was just sent in ipratropium nasal spray for severe rhinorrhea Discussed that if the above is not effective in controlling her symptoms could try adding Singulair

## 2023-11-18 DIAGNOSIS — Z1231 Encounter for screening mammogram for malignant neoplasm of breast: Secondary | ICD-10-CM | POA: Diagnosis not present

## 2023-11-18 DIAGNOSIS — Z124 Encounter for screening for malignant neoplasm of cervix: Secondary | ICD-10-CM | POA: Diagnosis not present

## 2023-11-18 DIAGNOSIS — Z1151 Encounter for screening for human papillomavirus (HPV): Secondary | ICD-10-CM | POA: Diagnosis not present

## 2023-11-18 DIAGNOSIS — Z6835 Body mass index (BMI) 35.0-35.9, adult: Secondary | ICD-10-CM | POA: Diagnosis not present

## 2023-11-18 DIAGNOSIS — Z01419 Encounter for gynecological examination (general) (routine) without abnormal findings: Secondary | ICD-10-CM | POA: Diagnosis not present

## 2023-12-02 ENCOUNTER — Encounter: Payer: Self-pay | Admitting: Internal Medicine

## 2023-12-02 ENCOUNTER — Other Ambulatory Visit: Payer: Self-pay

## 2023-12-02 DIAGNOSIS — E669 Obesity, unspecified: Secondary | ICD-10-CM

## 2023-12-02 MED ORDER — SEMAGLUTIDE-WEIGHT MANAGEMENT 1 MG/0.5ML ~~LOC~~ SOAJ: 1.0000 mg | SUBCUTANEOUS | 0 refills | Status: DC

## 2024-01-02 ENCOUNTER — Encounter: Payer: Self-pay | Admitting: Internal Medicine

## 2024-01-05 ENCOUNTER — Other Ambulatory Visit: Payer: Self-pay

## 2024-01-05 DIAGNOSIS — E669 Obesity, unspecified: Secondary | ICD-10-CM

## 2024-01-05 MED ORDER — SEMAGLUTIDE-WEIGHT MANAGEMENT 1.7 MG/0.75ML ~~LOC~~ SOAJ: 1.7000 mg | SUBCUTANEOUS | 0 refills | Status: DC

## 2024-01-28 ENCOUNTER — Other Ambulatory Visit: Payer: Self-pay | Admitting: Internal Medicine

## 2024-01-28 DIAGNOSIS — E669 Obesity, unspecified: Secondary | ICD-10-CM

## 2024-02-21 ENCOUNTER — Encounter: Payer: Self-pay | Admitting: Internal Medicine

## 2024-02-23 ENCOUNTER — Other Ambulatory Visit: Payer: Self-pay

## 2024-02-23 DIAGNOSIS — E669 Obesity, unspecified: Secondary | ICD-10-CM

## 2024-02-23 MED ORDER — SEMAGLUTIDE-WEIGHT MANAGEMENT 1.7 MG/0.75ML ~~LOC~~ SOAJ: 1.7000 mg | SUBCUTANEOUS | 2 refills | Status: DC

## 2024-03-09 ENCOUNTER — Other Ambulatory Visit: Payer: Self-pay

## 2024-03-09 DIAGNOSIS — E669 Obesity, unspecified: Secondary | ICD-10-CM

## 2024-03-09 MED ORDER — SEMAGLUTIDE-WEIGHT MANAGEMENT 1.7 MG/0.75ML ~~LOC~~ SOAJ: 1.7000 mg | SUBCUTANEOUS | 2 refills | Status: DC

## 2024-05-06 ENCOUNTER — Encounter: Payer: Self-pay | Admitting: Internal Medicine

## 2024-05-26 ENCOUNTER — Encounter: Payer: Self-pay | Admitting: Internal Medicine

## 2024-06-01 ENCOUNTER — Other Ambulatory Visit: Payer: Self-pay

## 2024-06-01 DIAGNOSIS — E669 Obesity, unspecified: Secondary | ICD-10-CM

## 2024-06-01 MED ORDER — WEGOVY 1 MG/0.5ML ~~LOC~~ SOAJ: 1.0000 mg | SUBCUTANEOUS | 0 refills | Status: DC

## 2024-06-28 DIAGNOSIS — J111 Influenza due to unidentified influenza virus with other respiratory manifestations: Secondary | ICD-10-CM | POA: Diagnosis not present

## 2024-07-06 ENCOUNTER — Encounter: Payer: Self-pay | Admitting: Internal Medicine

## 2024-07-07 ENCOUNTER — Other Ambulatory Visit: Payer: Self-pay

## 2024-07-07 ENCOUNTER — Other Ambulatory Visit: Payer: Self-pay | Admitting: Family

## 2024-07-07 DIAGNOSIS — E669 Obesity, unspecified: Secondary | ICD-10-CM

## 2024-07-07 MED ORDER — ONDANSETRON HCL 4 MG PO TABS: 4.0000 mg | ORAL_TABLET | Freq: Three times a day (TID) | ORAL | 5 refills | Status: AC | PRN

## 2024-07-07 MED ORDER — WEGOVY 1 MG/0.5ML ~~LOC~~ SOAJ: 1.0000 mg | SUBCUTANEOUS | 2 refills | Status: DC

## 2024-07-29 NOTE — Patient Instructions (Addendum)
" ° ° ° ° ° ° °  Medications changes include :   wegovy  1.5 mg daily   Start multivitamin.  Increase protein.      Return for follow up as scheduled.  "

## 2024-07-29 NOTE — Progress Notes (Unsigned)
" ° ° °  Subjective:    Patient ID: Katelyn Horne, female    DOB: 11-Mar-1971, 54 y.o.   MRN: [id number]      HPI Corita is here for No chief complaint on file.   She is currently on Wegovy  1 mg weekly.  She is interested in switching to the Wegovy  pill     Medications and allergies reviewed with patient and updated if appropriate.  Medications Ordered Prior to Encounter[1]  Review of Systems     Objective:  There were no vitals filed for this visit. BP Readings from Last 3 Encounters:  10/30/23 116/72  04/07/23 118/76  02/12/23 109/70   Wt Readings from Last 3 Encounters:  10/30/23 242 lb (109.8 kg)  04/07/23 215 lb (97.5 kg)  02/12/23 201 lb (91.2 kg)   There is no height or weight on file to calculate BMI.    Physical Exam         Assessment & Plan:    See Problem List for Assessment and Plan of chronic medical problems.         [1]  Current Outpatient Medications on File Prior to Visit  Medication Sig Dispense Refill   cetirizine (ZYRTEC) 10 MG tablet Take 10 mg by mouth daily.     fluticasone (FLONASE) 50 MCG/ACT nasal spray Place into both nostrils.     ipratropium (ATROVENT) 0.03 % nasal spray Place into both nostrils.     Multiple Vitamin (MULTIVITAMIN) capsule Take 1 capsule by mouth daily.     ondansetron  (ZOFRAN ) 4 MG tablet Take 1 tablet (4 mg total) by mouth every 8 (eight) hours as needed for nausea or vomiting. 20 tablet 5   Probiotic Product (PROBIOTIC ADVANCED PO) Take by mouth.     semaglutide -weight management (WEGOVY ) 1 MG/0.5ML SOAJ SQ injection Inject 1 mg into the skin once a week. 2 mL 2   semaglutide -weight management (WEGOVY ) 1.7 MG/0.75ML SOAJ SQ injection Inject 1.7 mg into the skin once a week. 3 mL 2   Semaglutide -Weight Management 1 MG/0.5ML SOAJ Inject 1 mg into the skin once a week. NPI: [id number]   NCPDP: [id number] 2 mL 0   vitamin C (ASCORBIC ACID) 250 MG tablet Take 250 mg by mouth daily.     WEGOVY  1.7 MG/0.75ML  SOAJ Inject contents of one pen (1.7 MG) subcutaneously once weekly as directed 3 mL 0   No current facility-administered medications on file prior to visit.   "

## 2024-07-30 ENCOUNTER — Ambulatory Visit: Admitting: Internal Medicine

## 2024-07-30 VITALS — BP 120/84 | HR 61 | Temp 98.1°F | Ht 70.0 in | Wt 256.0 lb

## 2024-07-30 DIAGNOSIS — Z6836 Body mass index (BMI) 36.0-36.9, adult: Secondary | ICD-10-CM

## 2024-07-30 DIAGNOSIS — E669 Obesity, unspecified: Secondary | ICD-10-CM

## 2024-07-30 MED ORDER — WEGOVY 1.5 MG PO TABS: 1.5000 mg | ORAL_TABLET | Freq: Every day | ORAL | 0 refills | Status: AC

## 2024-07-30 NOTE — Assessment & Plan Note (Signed)
 Chronic BMI 36.73 She currently taking wegovy  1 mg but not weekly Tolerates with occ nausea She is exercising regularly now but was not for the past couple of months She is not eating enough and not eating regularly - stressed eating regularly, stressed protein Start good daily MVI, good hydration Start wegovy  pill 1.5 mg daily Fu in 3 months
